# Patient Record
Sex: Female | Born: 1975 | Race: Black or African American | Hispanic: No | Marital: Single | Smoking: Never smoker
Health system: Southern US, Community
[De-identification: ages and names within clinical notes are randomized; demographics above are authoritative.]

## PROBLEM LIST (undated history)

## (undated) DIAGNOSIS — Z5189 Encounter for other specified aftercare: Secondary | ICD-10-CM

## (undated) DIAGNOSIS — T7840XA Allergy, unspecified, initial encounter: Secondary | ICD-10-CM

## (undated) DIAGNOSIS — E739 Lactose intolerance, unspecified: Secondary | ICD-10-CM

## (undated) DIAGNOSIS — G43909 Migraine, unspecified, not intractable, without status migrainosus: Secondary | ICD-10-CM

## (undated) DIAGNOSIS — L723 Sebaceous cyst: Secondary | ICD-10-CM

## (undated) DIAGNOSIS — D219 Benign neoplasm of connective and other soft tissue, unspecified: Secondary | ICD-10-CM

## (undated) DIAGNOSIS — K5900 Constipation, unspecified: Secondary | ICD-10-CM

## (undated) DIAGNOSIS — B977 Papillomavirus as the cause of diseases classified elsewhere: Secondary | ICD-10-CM

## (undated) DIAGNOSIS — F32A Depression, unspecified: Secondary | ICD-10-CM

## (undated) DIAGNOSIS — M549 Dorsalgia, unspecified: Secondary | ICD-10-CM

## (undated) DIAGNOSIS — I10 Essential (primary) hypertension: Secondary | ICD-10-CM

## (undated) DIAGNOSIS — R7303 Prediabetes: Secondary | ICD-10-CM

## (undated) DIAGNOSIS — F329 Major depressive disorder, single episode, unspecified: Secondary | ICD-10-CM

## (undated) DIAGNOSIS — B009 Herpesviral infection, unspecified: Secondary | ICD-10-CM

## (undated) DIAGNOSIS — F419 Anxiety disorder, unspecified: Secondary | ICD-10-CM

## (undated) DIAGNOSIS — I517 Cardiomegaly: Secondary | ICD-10-CM

## (undated) DIAGNOSIS — J45909 Unspecified asthma, uncomplicated: Secondary | ICD-10-CM

## (undated) DIAGNOSIS — N870 Mild cervical dysplasia: Secondary | ICD-10-CM

## (undated) DIAGNOSIS — N871 Moderate cervical dysplasia: Secondary | ICD-10-CM

## (undated) DIAGNOSIS — D649 Anemia, unspecified: Secondary | ICD-10-CM

## (undated) DIAGNOSIS — M79673 Pain in unspecified foot: Secondary | ICD-10-CM

## (undated) HISTORY — DX: Sebaceous cyst: L72.3

## (undated) HISTORY — PX: CERVICAL BIOPSY  W/ LOOP ELECTRODE EXCISION: SUR135

## (undated) HISTORY — DX: Mild cervical dysplasia: N87.0

## (undated) HISTORY — DX: Herpesviral infection, unspecified: B00.9

## (undated) HISTORY — DX: Allergy, unspecified, initial encounter: T78.40XA

## (undated) HISTORY — DX: Constipation, unspecified: K59.00

## (undated) HISTORY — DX: Dorsalgia, unspecified: M54.9

## (undated) HISTORY — DX: Anxiety disorder, unspecified: F41.9

## (undated) HISTORY — DX: Depression, unspecified: F32.A

## (undated) HISTORY — DX: Benign neoplasm of connective and other soft tissue, unspecified: D21.9

## (undated) HISTORY — DX: Moderate cervical dysplasia: N87.1

## (undated) HISTORY — PX: IRRIGATION AND DEBRIDEMENT SEBACEOUS CYST: SHX5255

## (undated) HISTORY — PX: TONSILLECTOMY AND ADENOIDECTOMY: SUR1326

## (undated) HISTORY — DX: Migraine, unspecified, not intractable, without status migrainosus: G43.909

## (undated) HISTORY — DX: Anemia, unspecified: D64.9

## (undated) HISTORY — PX: COLPOSCOPY: SHX161

## (undated) HISTORY — DX: Encounter for other specified aftercare: Z51.89

## (undated) HISTORY — DX: Cardiomegaly: I51.7

## (undated) HISTORY — DX: Prediabetes: R73.03

## (undated) HISTORY — DX: Papillomavirus as the cause of diseases classified elsewhere: B97.7

## (undated) HISTORY — DX: Pain in unspecified foot: M79.673

## (undated) HISTORY — DX: Lactose intolerance, unspecified: E73.9

## (undated) HISTORY — DX: Essential (primary) hypertension: I10

## (undated) HISTORY — DX: Unspecified asthma, uncomplicated: J45.909

---
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
## 1999-01-10 ENCOUNTER — Other Ambulatory Visit: Admission: RE | Admit: 1999-01-10 | Discharge: 1999-01-10 | Payer: Self-pay | Admitting: Obstetrics and Gynecology

## 1999-05-30 ENCOUNTER — Other Ambulatory Visit: Admission: RE | Admit: 1999-05-30 | Discharge: 1999-05-30 | Payer: Self-pay | Admitting: Obstetrics and Gynecology

## 1999-07-27 ENCOUNTER — Inpatient Hospital Stay (HOSPITAL_COMMUNITY): Admission: AD | Admit: 1999-07-27 | Discharge: 1999-07-27 | Payer: Self-pay | Admitting: Gynecology

## 1999-08-06 ENCOUNTER — Other Ambulatory Visit: Admission: RE | Admit: 1999-08-06 | Discharge: 1999-08-06 | Payer: Self-pay | Admitting: Obstetrics and Gynecology

## 1999-08-14 ENCOUNTER — Encounter: Admission: RE | Admit: 1999-08-14 | Discharge: 1999-11-12 | Payer: Self-pay | Admitting: Gynecology

## 1999-11-23 ENCOUNTER — Inpatient Hospital Stay (HOSPITAL_COMMUNITY): Admission: AD | Admit: 1999-11-23 | Discharge: 1999-11-23 | Payer: Self-pay | Admitting: Gynecology

## 1999-12-03 ENCOUNTER — Inpatient Hospital Stay (HOSPITAL_COMMUNITY): Admission: AD | Admit: 1999-12-03 | Discharge: 1999-12-06 | Payer: Self-pay | Admitting: Gynecology

## 1999-12-03 ENCOUNTER — Encounter (INDEPENDENT_AMBULATORY_CARE_PROVIDER_SITE_OTHER): Payer: Self-pay

## 2000-01-20 ENCOUNTER — Other Ambulatory Visit: Admission: RE | Admit: 2000-01-20 | Discharge: 2000-01-20 | Payer: Self-pay | Admitting: Internal Medicine

## 2000-04-20 ENCOUNTER — Emergency Department (HOSPITAL_COMMUNITY): Admission: EM | Admit: 2000-04-20 | Discharge: 2000-04-20 | Payer: Self-pay | Admitting: Emergency Medicine

## 2000-04-27 ENCOUNTER — Other Ambulatory Visit: Admission: RE | Admit: 2000-04-27 | Discharge: 2000-04-27 | Payer: Self-pay | Admitting: Gynecology

## 2000-10-15 DIAGNOSIS — N870 Mild cervical dysplasia: Secondary | ICD-10-CM

## 2000-10-15 HISTORY — DX: Mild cervical dysplasia: N87.0

## 2000-10-16 ENCOUNTER — Other Ambulatory Visit: Admission: RE | Admit: 2000-10-16 | Discharge: 2000-10-16 | Payer: Self-pay | Admitting: Gynecology

## 2000-10-16 ENCOUNTER — Encounter (INDEPENDENT_AMBULATORY_CARE_PROVIDER_SITE_OTHER): Payer: Self-pay | Admitting: Specialist

## 2001-06-11 ENCOUNTER — Other Ambulatory Visit: Admission: RE | Admit: 2001-06-11 | Discharge: 2001-06-11 | Payer: Self-pay | Admitting: Gynecology

## 2001-10-13 ENCOUNTER — Other Ambulatory Visit: Admission: RE | Admit: 2001-10-13 | Discharge: 2001-10-13 | Payer: Self-pay | Admitting: Gynecology

## 2002-01-28 ENCOUNTER — Other Ambulatory Visit: Admission: RE | Admit: 2002-01-28 | Discharge: 2002-01-28 | Payer: Self-pay | Admitting: Gynecology

## 2002-02-02 ENCOUNTER — Emergency Department (HOSPITAL_COMMUNITY): Admission: EM | Admit: 2002-02-02 | Discharge: 2002-02-02 | Payer: Self-pay

## 2002-08-15 DIAGNOSIS — N871 Moderate cervical dysplasia: Secondary | ICD-10-CM

## 2002-08-15 HISTORY — DX: Moderate cervical dysplasia: N87.1

## 2002-08-24 ENCOUNTER — Emergency Department (HOSPITAL_COMMUNITY): Admission: EM | Admit: 2002-08-24 | Discharge: 2002-08-24 | Payer: Self-pay | Admitting: Emergency Medicine

## 2003-02-09 ENCOUNTER — Other Ambulatory Visit: Admission: RE | Admit: 2003-02-09 | Discharge: 2003-02-09 | Payer: Self-pay | Admitting: Obstetrics and Gynecology

## 2003-04-06 ENCOUNTER — Other Ambulatory Visit: Admission: RE | Admit: 2003-04-06 | Discharge: 2003-04-06 | Payer: Self-pay | Admitting: Obstetrics and Gynecology

## 2004-10-21 ENCOUNTER — Other Ambulatory Visit: Admission: RE | Admit: 2004-10-21 | Discharge: 2004-10-21 | Payer: Self-pay | Admitting: Obstetrics and Gynecology

## 2004-12-30 ENCOUNTER — Ambulatory Visit: Payer: Self-pay | Admitting: Internal Medicine

## 2005-01-06 ENCOUNTER — Ambulatory Visit: Payer: Self-pay | Admitting: Internal Medicine

## 2005-01-08 ENCOUNTER — Ambulatory Visit: Payer: Self-pay | Admitting: Internal Medicine

## 2005-03-06 ENCOUNTER — Ambulatory Visit: Payer: Self-pay | Admitting: Internal Medicine

## 2005-03-06 ENCOUNTER — Other Ambulatory Visit: Admission: RE | Admit: 2005-03-06 | Discharge: 2005-03-06 | Payer: Self-pay | Admitting: Gynecology

## 2005-03-17 ENCOUNTER — Encounter: Admission: RE | Admit: 2005-03-17 | Discharge: 2005-06-15 | Payer: Self-pay | Admitting: Internal Medicine

## 2005-03-31 ENCOUNTER — Ambulatory Visit: Payer: Self-pay | Admitting: Internal Medicine

## 2005-04-15 ENCOUNTER — Ambulatory Visit: Payer: Self-pay | Admitting: Internal Medicine

## 2005-12-02 ENCOUNTER — Other Ambulatory Visit: Admission: RE | Admit: 2005-12-02 | Discharge: 2005-12-02 | Payer: Self-pay | Admitting: *Deleted

## 2006-01-07 ENCOUNTER — Ambulatory Visit: Payer: Self-pay | Admitting: Internal Medicine

## 2006-01-12 ENCOUNTER — Ambulatory Visit: Payer: Self-pay | Admitting: Internal Medicine

## 2006-03-03 ENCOUNTER — Ambulatory Visit: Payer: Self-pay | Admitting: Internal Medicine

## 2006-03-10 ENCOUNTER — Ambulatory Visit: Payer: Self-pay | Admitting: Internal Medicine

## 2006-03-11 ENCOUNTER — Encounter: Admission: RE | Admit: 2006-03-11 | Discharge: 2006-06-01 | Payer: Self-pay | Admitting: Internal Medicine

## 2007-03-16 DIAGNOSIS — B977 Papillomavirus as the cause of diseases classified elsewhere: Secondary | ICD-10-CM

## 2007-03-16 HISTORY — DX: Papillomavirus as the cause of diseases classified elsewhere: B97.7

## 2007-03-25 ENCOUNTER — Other Ambulatory Visit: Admission: RE | Admit: 2007-03-25 | Discharge: 2007-03-25 | Payer: Self-pay | Admitting: Gynecology

## 2007-04-17 ENCOUNTER — Emergency Department (HOSPITAL_COMMUNITY): Admission: EM | Admit: 2007-04-17 | Discharge: 2007-04-17 | Payer: Self-pay | Admitting: Family Medicine

## 2007-06-01 ENCOUNTER — Ambulatory Visit: Payer: Self-pay | Admitting: Internal Medicine

## 2007-06-14 ENCOUNTER — Encounter: Admission: RE | Admit: 2007-06-14 | Discharge: 2007-06-14 | Payer: Self-pay | Admitting: Ophthalmology

## 2007-06-14 ENCOUNTER — Encounter (INDEPENDENT_AMBULATORY_CARE_PROVIDER_SITE_OTHER): Payer: Self-pay | Admitting: Ophthalmology

## 2007-06-14 ENCOUNTER — Other Ambulatory Visit: Admission: RE | Admit: 2007-06-14 | Discharge: 2007-06-14 | Payer: Self-pay | Admitting: Ophthalmology

## 2008-01-31 ENCOUNTER — Emergency Department (HOSPITAL_COMMUNITY): Admission: EM | Admit: 2008-01-31 | Discharge: 2008-01-31 | Payer: Self-pay | Admitting: Family Medicine

## 2008-02-17 ENCOUNTER — Other Ambulatory Visit: Admission: RE | Admit: 2008-02-17 | Discharge: 2008-02-17 | Payer: Self-pay | Admitting: Gynecology

## 2008-05-30 ENCOUNTER — Encounter: Payer: Self-pay | Admitting: *Deleted

## 2008-05-30 DIAGNOSIS — J309 Allergic rhinitis, unspecified: Secondary | ICD-10-CM | POA: Insufficient documentation

## 2008-05-30 DIAGNOSIS — J4520 Mild intermittent asthma, uncomplicated: Secondary | ICD-10-CM

## 2008-05-30 DIAGNOSIS — J329 Chronic sinusitis, unspecified: Secondary | ICD-10-CM | POA: Insufficient documentation

## 2008-11-08 ENCOUNTER — Encounter: Payer: Self-pay | Admitting: Women's Health

## 2008-11-08 ENCOUNTER — Ambulatory Visit: Payer: Self-pay | Admitting: Women's Health

## 2008-11-08 ENCOUNTER — Other Ambulatory Visit: Admission: RE | Admit: 2008-11-08 | Discharge: 2008-11-08 | Payer: Self-pay | Admitting: Obstetrics and Gynecology

## 2009-02-09 ENCOUNTER — Ambulatory Visit: Payer: Self-pay | Admitting: Internal Medicine

## 2009-02-09 LAB — CONVERTED CEMR LAB
ALT: 17 units/L (ref 0–35)
AST: 19 units/L (ref 0–37)
Albumin: 3.9 g/dL (ref 3.5–5.2)
Alkaline Phosphatase: 81 units/L (ref 39–117)
BUN: 10 mg/dL (ref 6–23)
Basophils Absolute: 0 10*3/uL (ref 0.0–0.1)
Basophils Relative: 0.5 % (ref 0.0–3.0)
Bilirubin Urine: NEGATIVE
Bilirubin, Direct: 0.2 mg/dL (ref 0.0–0.3)
CO2: 27 meq/L (ref 19–32)
Calcium: 9.1 mg/dL (ref 8.4–10.5)
Chloride: 106 meq/L (ref 96–112)
Cholesterol: 184 mg/dL (ref 0–200)
Creatinine, Ser: 0.6 mg/dL (ref 0.4–1.2)
Eosinophils Absolute: 0.1 10*3/uL (ref 0.0–0.7)
Eosinophils Relative: 1.6 % (ref 0.0–5.0)
GFR calc Af Amer: 149 mL/min
GFR calc non Af Amer: 123 mL/min
Glucose, Bld: 105 mg/dL — ABNORMAL HIGH (ref 70–99)
HCT: 37.2 % (ref 36.0–46.0)
HDL: 58.6 mg/dL (ref >39.0)
Hemoglobin, Urine: NEGATIVE
Hemoglobin: 12.6 g/dL (ref 12.0–15.0)
Ketones, ur: NEGATIVE mg/dL
LDL Cholesterol: 118 mg/dL — ABNORMAL HIGH (ref 0–99)
Leukocytes, UA: NEGATIVE
Lymphocytes Relative: 43 % (ref 12.0–46.0)
MCHC: 34 g/dL (ref 30.0–36.0)
MCV: 82.9 fL (ref 78.0–100.0)
Monocytes Absolute: 0.4 10*3/uL (ref 0.1–1.0)
Monocytes Relative: 7.7 % (ref 3.0–12.0)
Neutro Abs: 2.4 10*3/uL (ref 1.4–7.7)
Neutrophils Relative %: 47.2 % (ref 43.0–77.0)
Nitrite: NEGATIVE
Platelets: 322 10*3/uL (ref 150–400)
Potassium: 3.8 meq/L (ref 3.5–5.1)
RBC: 4.48 M/uL (ref 3.87–5.11)
RDW: 11.6 % (ref 11.5–14.6)
Sodium: 139 meq/L (ref 135–145)
Specific Gravity, Urine: >=1.03 (ref 1.000–1.035)
TSH: 0.91 microintl units/mL (ref 0.35–5.50)
Total Bilirubin: 1 mg/dL (ref 0.3–1.2)
Total CHOL/HDL Ratio: 3.1
Total Protein: 7.2 g/dL (ref 6.0–8.3)
Triglycerides: 39 mg/dL (ref 0–149)
Urine Glucose: NEGATIVE mg/dL
Urobilinogen, UA: 0.2 (ref 0.0–1.0)
VLDL: 8 mg/dL (ref 0–40)
WBC: 5.1 10*3/uL (ref 4.5–10.5)
pH: 6 (ref 5.0–8.0)

## 2009-02-16 ENCOUNTER — Encounter: Payer: Self-pay | Admitting: Internal Medicine

## 2009-02-16 DIAGNOSIS — R7309 Other abnormal glucose: Secondary | ICD-10-CM | POA: Insufficient documentation

## 2009-02-20 DIAGNOSIS — K219 Gastro-esophageal reflux disease without esophagitis: Secondary | ICD-10-CM

## 2009-06-15 ENCOUNTER — Other Ambulatory Visit: Admission: RE | Admit: 2009-06-15 | Discharge: 2009-06-15 | Payer: Self-pay | Admitting: Obstetrics and Gynecology

## 2009-06-15 ENCOUNTER — Encounter: Payer: Self-pay | Admitting: Obstetrics and Gynecology

## 2009-06-15 ENCOUNTER — Ambulatory Visit: Payer: Self-pay | Admitting: Obstetrics and Gynecology

## 2010-12-23 ENCOUNTER — Ambulatory Visit
Admission: RE | Admit: 2010-12-23 | Discharge: 2010-12-23 | Payer: Self-pay | Source: Home / Self Care | Attending: Women's Health | Admitting: Women's Health

## 2011-08-04 ENCOUNTER — Other Ambulatory Visit: Payer: Self-pay | Admitting: *Deleted

## 2011-08-04 MED ORDER — NORGESTIM-ETH ESTRAD TRIPHASIC 0.18/0.215/0.25 MG-35 MCG PO TABS: 1.0000 | ORAL_TABLET | Freq: Every day | ORAL | Status: DC

## 2011-08-04 NOTE — Telephone Encounter (Signed)
Pt called requesting a sample of ortho tri cyclen sample, we dont ave any. Per [place] can get for $9 at [facility] or [facility]. Ok per [place] to send a Rx to pharmacy but pt has to schedule AEX. Apt 9/27. Starts new job 08/25/11. kw

## 2011-08-05 MED ORDER — NORGESTIM-ETH ESTRAD TRIPHASIC 0.18/0.215/0.25 MG-35 MCG PO TABS: 1.0000 | ORAL_TABLET | Freq: Every day | ORAL | Status: DC

## 2011-08-05 NOTE — Telephone Encounter (Signed)
Addended byValeda Malm L on: 08/05/2011 03:10 PM   Modules accepted: Orders

## 2011-08-05 NOTE — Telephone Encounter (Signed)
Changed oc's to escribe. Was set on print. Patient said rx was not there.

## 2011-09-04 DIAGNOSIS — B009 Herpesviral infection, unspecified: Secondary | ICD-10-CM | POA: Insufficient documentation

## 2011-09-04 DIAGNOSIS — B977 Papillomavirus as the cause of diseases classified elsewhere: Secondary | ICD-10-CM | POA: Insufficient documentation

## 2011-09-04 DIAGNOSIS — N870 Mild cervical dysplasia: Secondary | ICD-10-CM | POA: Insufficient documentation

## 2011-09-11 ENCOUNTER — Encounter: Payer: Self-pay | Admitting: Women's Health

## 2011-11-20 ENCOUNTER — Encounter: Payer: Self-pay | Admitting: *Deleted

## 2011-11-24 ENCOUNTER — Encounter: Payer: Self-pay | Admitting: Women's Health

## 2011-12-19 ENCOUNTER — Encounter: Payer: Self-pay | Admitting: Women's Health

## 2012-01-14 ENCOUNTER — Encounter: Payer: Self-pay | Admitting: Women's Health

## 2012-01-15 ENCOUNTER — Other Ambulatory Visit: Payer: Self-pay | Admitting: Women's Health

## 2012-01-15 NOTE — Telephone Encounter (Signed)
Pt has her CE scheduled 01/19/12.

## 2012-01-19 ENCOUNTER — Ambulatory Visit (INDEPENDENT_AMBULATORY_CARE_PROVIDER_SITE_OTHER): Payer: 59 | Admitting: Women's Health

## 2012-01-19 ENCOUNTER — Encounter: Payer: Self-pay | Admitting: Women's Health

## 2012-01-19 ENCOUNTER — Other Ambulatory Visit (HOSPITAL_COMMUNITY)
Admission: RE | Admit: 2012-01-19 | Discharge: 2012-01-19 | Disposition: A | Discharge status: Home / Self Care | Payer: 59 | Source: Ambulatory Visit | Attending: Obstetrics and Gynecology | Admitting: Obstetrics and Gynecology

## 2012-01-19 VITALS — BP 126/82 | Ht 63.0 in | Wt 244.0 lb

## 2012-01-19 DIAGNOSIS — Z01419 Encounter for gynecological examination (general) (routine) without abnormal findings: Secondary | ICD-10-CM | POA: Insufficient documentation

## 2012-01-19 DIAGNOSIS — Z833 Family history of diabetes mellitus: Secondary | ICD-10-CM

## 2012-01-19 DIAGNOSIS — Z13220 Encounter for screening for lipoid disorders: Secondary | ICD-10-CM

## 2012-01-19 NOTE — Progress Notes (Signed)
Lynn Hansen 10-Dec-1976 [id number]    History:    The patient presents for annual exam.  Monthly 3 day cycle, not sexually active. Does have a negative STD screen. 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 History of HSV with rare outbreaks.   Past medical history, past surgical history, family history and social history were all reviewed and documented in the EPIC chart. Daughter 33 , doing well. Works at [employer]. Adopted   ROS:  A  ROS was performed and pertinent positives and negatives are included in the history.  Exam:  Filed Vitals:   01/19/12 0807  BP: 126/82    General appearance:  Normal Head/Neck:  Normal, without cervical or supraclavicular adenopathy. Thyroid:  Symmetrical, normal in size, without palpable masses or nodularity. Respiratory  Effort:  Normal  Auscultation:  Clear without wheezing or rhonchi Cardiovascular  Auscultation:  Regular rate, without rubs, murmurs or gallops  Edema/varicosities:  Not grossly evident Abdominal  Soft,nontender, without masses, guarding or rebound.  Liver/spleen:  No organomegaly noted  Hernia:  None appreciated  Skin  Inspection:  Grossly normal  Palpation:  Grossly normal Neurologic/psychiatric  Orientation:  Normal with appropriate conversation.  Mood/affect:  Normal  Genitourinary    Breasts: Examined lying and sitting.     Right: Without masses, retractions, discharge or axillary adenopathy.     Left: Without masses, retractions, discharge or axillary adenopathy.   Inguinal/mons:  Normal without inguinal adenopathy  External genitalia:  Normal  BUS/Urethra/Skene's glands:  Normal  Bladder:  Normal  Vagina:  Normal  Cervix:  Normal  Uterus:   normal in size, shape and contour.  Midline and mobile  Adnexa/parametria:     Rt: Without masses or tenderness.   Lt: Without masses or tenderness.  Anus and perineum: Normal  Digital rectal  exam: Normal sphincter tone without palpated masses or tenderness  Assessment/Plan:  36 y.o. SBF G3 P1 for annual exam with no complaints.  History a LEEP CIN-2 endocervical margin involvement, ectocervical margins free Paps normal 2009,2010-no paps after 2010 Obesity  Plan: Reviewed importance of Pap followup, (had no insurance). SBE's, exercise, calcium rich diet, fish oil supplement daily encouraged. Discussion on importance of weight loss for health. Reviewed Weight Watchers, decreasing calories and increasing exercise. Contraception options were reviewed, declines need at this time, would like Mirena IUD. Information given reviewed slight risk for infection, hemorrhage, perforation. Review Dr. Eda Paschal to place with cycle. Condoms encouraged to become sexually active. CBC, glucose, lipid profile, UA and Pap.   Harrington Challenger [facility], 9:11 AM 01/19/2012

## 2012-01-27 ENCOUNTER — Other Ambulatory Visit: Payer: Self-pay | Admitting: *Deleted

## 2012-01-27 DIAGNOSIS — R319 Hematuria, unspecified: Secondary | ICD-10-CM

## 2012-01-30 ENCOUNTER — Other Ambulatory Visit: Payer: 59

## 2012-02-02 ENCOUNTER — Encounter: Payer: Self-pay | Admitting: *Deleted

## 2012-02-02 NOTE — Progress Notes (Signed)
Patient ID: Lynn Hansen, female   DOB: May 22, 1976, 36 y.o.   MRN: [id number] Pt called wanting refill on valtrex rx, left message for pt to call to find out which pharmacy rx will be sent to.

## 2012-04-12 ENCOUNTER — Other Ambulatory Visit: Payer: Self-pay | Admitting: *Deleted

## 2012-04-12 MED ORDER — FLUCONAZOLE 150 MG PO TABS: 150.0000 mg | ORAL_TABLET | Freq: Once | ORAL | Status: DC

## 2012-04-12 NOTE — Telephone Encounter (Signed)
Okay to call in Diflucan 150 , 1 dose, #1, no refill for patient, instruct office visit if no relief.

## 2012-04-26 ENCOUNTER — Other Ambulatory Visit: Payer: Self-pay | Admitting: *Deleted

## 2012-07-16 ENCOUNTER — Other Ambulatory Visit: Payer: Self-pay | Admitting: Women's Health

## 2012-07-16 NOTE — Telephone Encounter (Signed)
Okay for Diflucan 150mg   times one dose office visit if no relief.

## 2012-07-26 ENCOUNTER — Telehealth: Payer: Self-pay | Admitting: Internal Medicine

## 2012-07-26 NOTE — Telephone Encounter (Signed)
Ok Thx 

## 2012-07-26 NOTE — Telephone Encounter (Signed)
Lynn Hansen was last seen in 2008 by Dr Posey Rea.  Do you want [place] to schedule her with you as a new pt? She has [employer].  She has 3 No Shows with other doctors and cancellations.

## 2012-07-30 NOTE — Telephone Encounter (Signed)
New Pt appt made for Nov. 19.  Pt is aware.

## 2012-10-08 ENCOUNTER — Encounter: Payer: Self-pay | Admitting: *Deleted

## 2012-10-08 NOTE — Progress Notes (Signed)
Patient ID: Lynn Hansen, female   DOB: [birth year], 36 y.o.   MRN: [id number] Pt called requesting refill on Diflucan due to yeast infection. Leave message on pt voicemail OV best.

## 2012-10-15 ENCOUNTER — Ambulatory Visit: Payer: 59 | Admitting: Women's Health

## 2012-11-02 ENCOUNTER — Ambulatory Visit: Payer: 59 | Admitting: Internal Medicine

## 2012-11-02 DIAGNOSIS — Z0289 Encounter for other administrative examinations: Secondary | ICD-10-CM

## 2012-11-08 ENCOUNTER — Other Ambulatory Visit: Payer: Self-pay | Admitting: Women's Health

## 2012-11-08 DIAGNOSIS — Z1231 Encounter for screening mammogram for malignant neoplasm of breast: Secondary | ICD-10-CM

## 2012-12-17 ENCOUNTER — Ambulatory Visit: Payer: 59

## 2013-01-03 ENCOUNTER — Ambulatory Visit (INDEPENDENT_AMBULATORY_CARE_PROVIDER_SITE_OTHER): Payer: 59 | Admitting: Women's Health

## 2013-01-03 ENCOUNTER — Encounter: Payer: Self-pay | Admitting: Women's Health

## 2013-01-03 DIAGNOSIS — N898 Other specified noninflammatory disorders of vagina: Secondary | ICD-10-CM

## 2013-01-03 LAB — WET PREP FOR TRICH, YEAST, CLUE

## 2013-01-03 MED ORDER — FLUCONAZOLE 150 MG PO TABS: ORAL_TABLET | ORAL | Status: DC

## 2013-01-03 NOTE — Patient Instructions (Addendum)
Monilial Vaginitis Vaginitis in a soreness, swelling and redness (inflammation) of the vagina and vulva. Monilial vaginitis is not a sexually transmitted infection. CAUSES  Yeast vaginitis is caused by yeast (candida) that is normally found in your vagina. With a yeast infection, the candida has overgrown in number to a point that upsets the chemical balance. SYMPTOMS   White, thick vaginal discharge.  Swelling, itching, redness and irritation of the vagina and possibly the lips of the vagina (vulva).  Burning or painful urination.  Painful intercourse. DIAGNOSIS  Things that may contribute to monilial vaginitis are:  Postmenopausal and virginal states.  Pregnancy.  Infections.  Being tired, sick or stressed, especially if you had monilial vaginitis in the past.  Diabetes. Good control will help lower the chance.  Birth control pills.  Tight fitting garments.  Using bubble bath, feminine sprays, douches or deodorant tampons.  Taking certain medications that kill germs (antibiotics).  Sporadic recurrence can occur if you become ill. TREATMENT  Your caregiver will give you medication.  There are several kinds of anti monilial vaginal creams and suppositories specific for monilial vaginitis. For recurrent yeast infections, use a suppository or cream in the vagina 2 times a week, or as directed.  Anti-monilial or steroid cream for the itching or irritation of the vulva may also be used. Get your caregiver's permission.  Painting the vagina with methylene blue solution may help if the monilial cream does not work.  Eating yogurt may help prevent monilial vaginitis. HOME CARE INSTRUCTIONS   Finish all medication as prescribed.  Do not have sex until treatment is completed or after your caregiver tells you it is okay.  Take warm sitz baths.  Do not douche.  Do not use tampons, especially scented ones.  Wear cotton underwear.  Avoid tight pants and panty  hose.  Tell your sexual partner that you have a yeast infection. They should go to their caregiver if they have symptoms such as mild rash or itching.  Your sexual partner should be treated as well if your infection is difficult to eliminate.  Practice safer sex. Use condoms.  Some vaginal medications cause latex condoms to fail. Vaginal medications that harm condoms are:  Cleocin cream.  Butoconazole (Femstat).  Terconazole (Terazol) vaginal suppository.  Miconazole (Monistat) (may be purchased over the counter). SEEK MEDICAL CARE IF:   You have a temperature by mouth above 102 F (38.9 C).  The infection is getting worse after 2 days of treatment.  The infection is not getting better after 3 days of treatment.  You develop blisters in or around your vagina.  You develop vaginal bleeding, and it is not your menstrual period.  You have pain when you urinate.  You develop intestinal problems.  You have pain with sexual intercourse. Document Released: 09/10/2005 Document Revised: 02/23/2012 Document Reviewed: 05/25/2009 ExitCare Patient Information 2013 [employer].  

## 2013-01-03 NOTE — Progress Notes (Signed)
Patient ID: Lynn Hansen, female   DOB: 1976-07-31, 37 y.o.   MRN: [id number] Presents with complaint of increased vaginal discharge with intense itching. Started 3 days ago. Has been working out at [employer] and getting more exercise when symptoms started. Same partner, denies urinary symptoms.  Exam: External genitalia erythematous at introitus, speculum exam moderate amount of a curdy white discharge noted, no odor. Wet prep positive for yeast. Bimanual no CMT or tenderness, abdomen obese.  Yeast vaginitis  Plan: Diflucan 150 by mouth today repeat in 3 days if needed. Prescription, yeast prevention discussed will call if no relief of symptoms.

## 2013-01-19 ENCOUNTER — Encounter: Payer: 59 | Admitting: Women's Health

## 2013-01-27 ENCOUNTER — Encounter: Payer: 59 | Admitting: Women's Health

## 2013-02-09 ENCOUNTER — Other Ambulatory Visit: Payer: Self-pay

## 2013-02-09 DIAGNOSIS — Z1231 Encounter for screening mammogram for malignant neoplasm of breast: Secondary | ICD-10-CM

## 2013-02-23 ENCOUNTER — Ambulatory Visit: Payer: 59

## 2013-04-13 ENCOUNTER — Ambulatory Visit: Payer: 59 | Admitting: Internal Medicine

## 2013-04-13 DIAGNOSIS — Z0289 Encounter for other administrative examinations: Secondary | ICD-10-CM

## 2013-06-10 ENCOUNTER — Encounter: Payer: Self-pay | Admitting: Women's Health

## 2013-06-28 ENCOUNTER — Encounter: Payer: Self-pay | Admitting: Gynecology

## 2013-06-28 ENCOUNTER — Ambulatory Visit (INDEPENDENT_AMBULATORY_CARE_PROVIDER_SITE_OTHER): Payer: 59 | Admitting: Gynecology

## 2013-06-28 DIAGNOSIS — N898 Other specified noninflammatory disorders of vagina: Secondary | ICD-10-CM

## 2013-06-28 DIAGNOSIS — B373 Candidiasis of vulva and vagina: Secondary | ICD-10-CM

## 2013-06-28 LAB — WET PREP FOR TRICH, YEAST, CLUE: Clue Cells Wet Prep HPF POC: NONE SEEN

## 2013-06-28 MED ORDER — FLUCONAZOLE 150 MG PO TABS: 150.0000 mg | ORAL_TABLET | Freq: Once | ORAL | Status: DC
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
## 2013-06-28 NOTE — Patient Instructions (Signed)
A Diflucan pill now. Repeated as needed for vaginal irritation and discharge.

## 2013-07-22 ENCOUNTER — Ambulatory Visit (INDEPENDENT_AMBULATORY_CARE_PROVIDER_SITE_OTHER): Payer: 59 | Admitting: Women's Health

## 2013-07-22 DIAGNOSIS — Z01419 Encounter for gynecological examination (general) (routine) without abnormal findings: Secondary | ICD-10-CM

## 2013-08-10 NOTE — Progress Notes (Signed)
Patient ID: Lynn Hansen, female   DOB: [birth year], 37 y.o.   MRN: [id number] No show

## 2013-08-29 ENCOUNTER — Encounter (HOSPITAL_COMMUNITY): Payer: Self-pay

## 2013-08-29 ENCOUNTER — Emergency Department (INDEPENDENT_AMBULATORY_CARE_PROVIDER_SITE_OTHER)
Admission: EM | Admit: 2013-08-29 | Discharge: 2013-08-29 | Disposition: A | Discharge status: Home / Self Care | Payer: 59 | Source: Home / Self Care | Attending: Family Medicine | Admitting: Family Medicine

## 2013-08-29 DIAGNOSIS — L723 Sebaceous cyst: Secondary | ICD-10-CM

## 2013-08-29 MED ORDER — SULFAMETHOXAZOLE-TRIMETHOPRIM 800-160 MG PO TABS: 1.0000 | ORAL_TABLET | Freq: Two times a day (BID) | ORAL | Status: DC
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
## 2013-08-29 NOTE — ED Notes (Signed)
C/o lump on right ear lobe for past few days, getting larger; reportedly similar to lump on breast that burst recently

## 2013-08-30 NOTE — ED Provider Notes (Signed)
Medical screening examination/treatment/procedure(s) were performed by resident physician or non-physician practitioner and as supervising physician I was immediately available for consultation/collaboration.   Barkley Bruns MD.   Linna Hoff, MD 08/30/13 [phone]

## 2013-09-27 ENCOUNTER — Ambulatory Visit (INDEPENDENT_AMBULATORY_CARE_PROVIDER_SITE_OTHER): Payer: 59 | Admitting: Women's Health

## 2013-09-27 ENCOUNTER — Encounter: Payer: Self-pay | Admitting: Women's Health

## 2013-09-27 ENCOUNTER — Other Ambulatory Visit (HOSPITAL_COMMUNITY)
Admission: RE | Admit: 2013-09-27 | Discharge: 2013-09-27 | Disposition: A | Discharge status: Home / Self Care | Payer: 59 | Source: Ambulatory Visit | Attending: Gynecology | Admitting: Gynecology

## 2013-09-27 VITALS — BP 128/88 | Ht 63.5 in | Wt 247.0 lb

## 2013-09-27 DIAGNOSIS — E079 Disorder of thyroid, unspecified: Secondary | ICD-10-CM

## 2013-09-27 DIAGNOSIS — Z01419 Encounter for gynecological examination (general) (routine) without abnormal findings: Secondary | ICD-10-CM | POA: Insufficient documentation

## 2013-09-27 DIAGNOSIS — Z833 Family history of diabetes mellitus: Secondary | ICD-10-CM

## 2013-09-27 LAB — URINALYSIS W MICROSCOPIC + REFLEX CULTURE
Bilirubin Urine: NEGATIVE
Glucose, UA: NEGATIVE mg/dL
Ketones, ur: NEGATIVE mg/dL
Specific Gravity, Urine: 1.02 (ref 1.005–1.030)
Urobilinogen, UA: 4 mg/dL — ABNORMAL HIGH (ref 0.0–1.0)

## 2013-09-27 NOTE — Patient Instructions (Signed)

## 2013-09-27 NOTE — Progress Notes (Signed)
Lynn Hansen 13-Apr-1976 [id number]    History:    The patient presents for annual exam. 2003 CIN-2 LEEP with positive endocervical margins and negative ectocervical margins, normal Paps after. HSV rare outbreaks. Concerns about blood pressure/ self-checks have been elevated.   Unknown FH/ adopted.  Regular periods, condoms,  not sexually active/ same partner for 3 years.   Saw orthopedist in February 2014 for foot pain/reports all normal lab work- diagnosed with Plantar Fasciitis.  Refused flu vaccine.   Past medical history, past surgical history, family history and social history were all reviewed and documented in the EPIC chart. 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  Exam:  Filed Vitals:   09/27/13 0935  BP: 128/88    General appearance:  Obese Head/Neck:  Normal, without cervical or supraclavicular adenopathy. Thyroid:  Symmetrical, normal in size, without palpable masses or nodularity. Respiratory  Effort:  Normal  Auscultation:  Clear without wheezing or rhonchi Cardiovascular  Auscultation:  Regular rate, without rubs, murmurs or gallops  Edema/varicosities:  Not grossly evident Abdominal  Soft,nontender, without masses, guarding or rebound.  Liver/spleen:  No organomegaly noted  Hernia:  None appreciated  Skin  Inspection:  Grossly normal  Palpation:  Grossly normal Neurologic/psychiatric  Orientation:  Normal with appropriate conversation.  Mood/affect:  Normal  Genitourinary    Breasts: Examined lying and sitting.     Right: Without masses, retractions, discharge or axillary adenopathy.     Left: Without masses, retractions, discharge or axillary adenopathy.   Inguinal/mons:  Normal without inguinal adenopathy  External genitalia:  Normal  BUS/Urethra/Skene's glands:  Normal  Bladder:  Normal; UA  Vagina:   Normal   Cervix:  Normal  Uterus:  Normal in size, shape and contour.  Midline and mobile  Adnexa/parametria:     Rt: Without masses or tenderness.   Lt: Without masses or tenderness.  Anus and perineum: Normal  Digital rectal exam: Normal sphincter tone without palpated masses or tenderness  Assessment/Plan:  37 y.o. G1P1 SBF for annual exam.     2003 LEEP CIN-2 normal Paps after HSV Obesity Borderline B/P  Plan: Continue healthy lifestyle change/ continue exercising with less pressure on joints/ low-carb/ sugar/ sodium diet. SBE's , continue condoms and sexually active.   Monitor blood pressure and if continues greater than 130/80 seek care at primary care for possible medication. UA, Pap.  Lynn Hansen [facility], 10:11 AM 09/27/2013

## 2013-09-30 ENCOUNTER — Other Ambulatory Visit: Payer: Self-pay | Admitting: Women's Health

## 2013-09-30 MED ORDER — FLUCONAZOLE 150 MG PO TABS: 150.0000 mg | ORAL_TABLET | Freq: Once | ORAL | Status: DC

## 2013-11-18 ENCOUNTER — Ambulatory Visit (INDEPENDENT_AMBULATORY_CARE_PROVIDER_SITE_OTHER): Payer: 59 | Admitting: Women's Health

## 2013-11-18 ENCOUNTER — Encounter: Payer: Self-pay | Admitting: Women's Health

## 2013-11-18 DIAGNOSIS — B3731 Acute candidiasis of vulva and vagina: Secondary | ICD-10-CM

## 2013-11-18 DIAGNOSIS — L293 Anogenital pruritus, unspecified: Secondary | ICD-10-CM

## 2013-11-18 DIAGNOSIS — N898 Other specified noninflammatory disorders of vagina: Secondary | ICD-10-CM

## 2013-11-18 DIAGNOSIS — B373 Candidiasis of vulva and vagina: Secondary | ICD-10-CM

## 2013-11-18 LAB — WET PREP FOR TRICH, YEAST, CLUE
Clue Cells Wet Prep HPF POC: NONE SEEN
Trich, Wet Prep: NONE SEEN

## 2013-11-18 MED ORDER — FLUCONAZOLE 100 MG PO TABS: ORAL_TABLET | ORAL | Status: DC

## 2013-11-18 NOTE — Progress Notes (Signed)
Patient ID: Lynn Hansen, female   DOB: Oct 05, 1976, 37 y.o.   MRN: [id number] Presents with complaint of vaginal itching. States vaginal itching symptoms increase after exercise. Was treated for yeast 2 months ago, good relief with one Diflucan. Same partner. Denies urinary symptoms, abdominal pain or fever.  Exam: Appears well, obese, external genitalia erythematous at introitus, speculum exam moderate amount of a curdy discharge noted. Wet prep positive for moderate yeast.  Yeast vaginitis  Plan: Diflucan 100 mg 2 tablets today repeat if needed. Aware of yeast prevention. Instructed to call if no relief of symptoms. Continue regular exercise

## 2013-11-18 NOTE — Patient Instructions (Signed)
Monilial Vaginitis  Vaginitis in a soreness, swelling and redness (inflammation) of the vagina and vulva. Monilial vaginitis is not a sexually transmitted infection.  CAUSES   Yeast vaginitis is caused by yeast (candida) that is normally found in your vagina. With a yeast infection, the candida has overgrown in number to a point that upsets the chemical balance.  SYMPTOMS   · White, thick vaginal discharge.  · Swelling, itching, redness and irritation of the vagina and possibly the lips of the vagina (vulva).  · Burning or painful urination.  · Painful intercourse.  DIAGNOSIS   Things that may contribute to monilial vaginitis are:  · Postmenopausal and virginal states.  · Pregnancy.  · Infections.  · Being tired, sick or stressed, especially if you had monilial vaginitis in the past.  · Diabetes. Good control will help lower the chance.  · Birth control pills.  · Tight fitting garments.  · Using bubble bath, feminine sprays, douches or deodorant tampons.  · Taking certain medications that kill germs (antibiotics).  · Sporadic recurrence can occur if you become ill.  TREATMENT   Your caregiver will give you medication.  · There are several kinds of anti monilial vaginal creams and suppositories specific for monilial vaginitis. For recurrent yeast infections, use a suppository or cream in the vagina 2 times a week, or as directed.  · Anti-monilial or steroid cream for the itching or irritation of the vulva may also be used. Get your caregiver's permission.  · Painting the vagina with methylene blue solution may help if the monilial cream does not work.  · Eating yogurt may help prevent monilial vaginitis.  HOME CARE INSTRUCTIONS   · Finish all medication as prescribed.  · Do not have sex until treatment is completed or after your caregiver tells you it is okay.  · Take warm sitz baths.  · Do not douche.  · Do not use tampons, especially scented ones.  · Wear cotton underwear.  · Avoid tight pants and panty  hose.  · Tell your sexual partner that you have a yeast infection. They should go to their caregiver if they have symptoms such as mild rash or itching.  · Your sexual partner should be treated as well if your infection is difficult to eliminate.  · Practice safer sex. Use condoms.  · Some vaginal medications cause latex condoms to fail. Vaginal medications that harm condoms are:  · Cleocin cream.  · Butoconazole (Femstat®).  · Terconazole (Terazol®) vaginal suppository.  · Miconazole (Monistat®) (may be purchased over the counter).  SEEK MEDICAL CARE IF:   · You have a temperature by mouth above 102° F (38.9° C).  · The infection is getting worse after 2 days of treatment.  · The infection is not getting better after 3 days of treatment.  · You develop blisters in or around your vagina.  · You develop vaginal bleeding, and it is not your menstrual period.  · You have pain when you urinate.  · You develop intestinal problems.  · You have pain with sexual intercourse.  Document Released: 09/10/2005 Document Revised: 02/23/2012 Document Reviewed: 05/25/2009  ExitCare® Patient Information ©2014 [employer].

## 2014-03-06 ENCOUNTER — Emergency Department (HOSPITAL_COMMUNITY)
Admission: EM | Admit: 2014-03-06 | Discharge: 2014-03-06 | Disposition: A | Discharge status: Home / Self Care | Payer: 59 | Attending: Emergency Medicine | Admitting: Emergency Medicine

## 2014-03-06 ENCOUNTER — Emergency Department (HOSPITAL_COMMUNITY): Payer: 59

## 2014-03-06 ENCOUNTER — Encounter (HOSPITAL_COMMUNITY): Payer: Self-pay | Admitting: Emergency Medicine

## 2014-03-06 ENCOUNTER — Encounter: Payer: Self-pay | Admitting: Internal Medicine

## 2014-03-06 DIAGNOSIS — R1012 Left upper quadrant pain: Secondary | ICD-10-CM | POA: Insufficient documentation

## 2014-03-06 DIAGNOSIS — R1013 Epigastric pain: Secondary | ICD-10-CM | POA: Insufficient documentation

## 2014-03-06 DIAGNOSIS — R0789 Other chest pain: Secondary | ICD-10-CM | POA: Insufficient documentation

## 2014-03-06 DIAGNOSIS — R1011 Right upper quadrant pain: Secondary | ICD-10-CM | POA: Insufficient documentation

## 2014-03-06 DIAGNOSIS — K920 Hematemesis: Secondary | ICD-10-CM | POA: Insufficient documentation

## 2014-03-06 DIAGNOSIS — R112 Nausea with vomiting, unspecified: Secondary | ICD-10-CM | POA: Insufficient documentation

## 2014-03-06 DIAGNOSIS — Z8619 Personal history of other infectious and parasitic diseases: Secondary | ICD-10-CM | POA: Insufficient documentation

## 2014-03-06 LAB — CBC WITH DIFFERENTIAL/PLATELET
BASOS ABS: 0 10*3/uL (ref 0.0–0.1)
Basophils Relative: 0 % (ref 0–1)
EOS ABS: 0.1 10*3/uL (ref 0.0–0.7)
EOS PCT: 2 % (ref 0–5)
HEMATOCRIT: 36.6 % (ref 36.0–46.0)
Hemoglobin: 12.8 g/dL (ref 12.0–15.0)
LYMPHS PCT: 49 % — AB (ref 12–46)
Lymphs Abs: 3.3 10*3/uL (ref 0.7–4.0)
MCH: 28.1 pg (ref 26.0–34.0)
MCHC: 35 g/dL (ref 30.0–36.0)
MCV: 80.4 fL (ref 78.0–100.0)
MONO ABS: 0.4 10*3/uL (ref 0.1–1.0)
Monocytes Relative: 6 % (ref 3–12)
Neutro Abs: 2.8 10*3/uL (ref 1.7–7.7)
Neutrophils Relative %: 42 % — ABNORMAL LOW (ref 43–77)
Platelets: 378 10*3/uL (ref 150–400)
RBC: 4.55 MIL/uL (ref 3.87–5.11)
RDW: 12.8 % (ref 11.5–15.5)
WBC: 6.6 10*3/uL (ref 4.0–10.5)

## 2014-03-06 LAB — COMPREHENSIVE METABOLIC PANEL
ALBUMIN: 4 g/dL (ref 3.5–5.2)
ALT: 17 U/L (ref 0–35)
AST: 23 U/L (ref 0–37)
Alkaline Phosphatase: 99 U/L (ref 39–117)
BUN: 8 mg/dL (ref 6–23)
CHLORIDE: 102 meq/L (ref 96–112)
CO2: 23 mEq/L (ref 19–32)
Calcium: 9.2 mg/dL (ref 8.4–10.5)
Creatinine, Ser: 0.49 mg/dL — ABNORMAL LOW (ref 0.50–1.10)
GFR calc Af Amer: >90 mL/min (ref >90)
GFR calc non Af Amer: >90 mL/min (ref >90)
GLUCOSE: 80 mg/dL (ref 70–99)
Potassium: 3.9 mEq/L (ref 3.7–5.3)
Sodium: 139 mEq/L (ref 137–147)
Total Bilirubin: 0.4 mg/dL (ref 0.3–1.2)
Total Protein: 7.9 g/dL (ref 6.0–8.3)

## 2014-03-06 LAB — TYPE AND SCREEN
ABO/RH(D): O POS
Antibody Screen: NEGATIVE

## 2014-03-06 LAB — LIPASE, BLOOD: LIPASE: 16 U/L (ref 11–59)

## 2014-03-06 LAB — ABO/RH: ABO/RH(D): O POS

## 2014-03-06 MED ORDER — MORPHINE SULFATE 4 MG/ML IJ SOLN
4.0000 mg | Freq: Once | INTRAMUSCULAR | Status: AC
Start: 2014-03-06 — End: 2014-03-06
  Administered 2014-03-06: 4 mg via INTRAVENOUS
  Filled 2014-03-06: qty 1

## 2014-03-06 MED ORDER — SODIUM CHLORIDE 0.9 % IV BOLUS (SEPSIS)
1000.0000 mL | Freq: Once | INTRAVENOUS | Status: AC
  Administered 2014-03-06: 1000 mL via INTRAVENOUS

## 2014-03-06 MED ORDER — PANTOPRAZOLE SODIUM 20 MG PO TBEC: 20.0000 mg | DELAYED_RELEASE_TABLET | Freq: Every day | ORAL | Status: DC

## 2014-03-06 MED ORDER — PANTOPRAZOLE SODIUM 40 MG IV SOLR
40.0000 mg | Freq: Once | INTRAVENOUS | Status: AC
Start: 2014-03-06 — End: 2014-03-06
  Administered 2014-03-06: 40 mg via INTRAVENOUS
  Filled 2014-03-06: qty 40

## 2014-03-06 MED ORDER — ONDANSETRON HCL 4 MG/2ML IJ SOLN
4.0000 mg | Freq: Once | INTRAMUSCULAR | Status: AC
  Administered 2014-03-06: 4 mg via INTRAVENOUS
  Filled 2014-03-06: qty 2
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
## 2014-03-06 NOTE — ED Notes (Signed)
While bringing pt back to room from lobby she stated she was feeling some chest pressure that started in the middle of her chest and went to her right shoulder. Pt stated it started while sitting the the lobby. PT placed on monitor once in room and EKG was obtained and shown to Dr Dorna Mai.

## 2014-03-06 NOTE — Discharge Instructions (Signed)
Read the information below.  Use the prescribed medication as directed.  Please discuss all new medications with your pharmacist.  You may return to the Emergency Department at any time for worsening condition or any new symptoms that concern you.  If you develop high fevers, abdominal pain, uncontrolled vomiting, increased bloody vomit, or are unable to tolerate fluids by mouth, return to the ER for a recheck.    Hematemesis This condition is the vomiting of blood. CAUSES  This can happen if you have a peptic ulcer or an irritation of the throat, stomach, or small bowel. Vomiting over and over again or swallowing blood from a nosebleed, coughing or facial injury can also result in bloody vomit. Anti-inflammatory pain medicines are a common cause of this potentially dangerous condition. The most serious causes of vomiting blood include:  Ulcers (a bacteria called H. pylori is common cause of ulcers).  Clotting problems.  Alcoholism.  Cirrhosis. TREATMENT  Treatment depends on the cause and the severity of the bleeding. Small amounts of blood streaks in the vomit is not the same as vomiting large amounts of bloody or dark, coffee grounds-like material. Weakness, fainting, dehydration, anemia, and continued alcohol or drug use increase the risk. Examination may include blood, vomit, or stool tests. The presence of bloody or dark stool that tests positive for blood (Hemoccult) means the bleeding has been going on for some time. Endoscopy and imaging studies may be done. Emergency treatment may include:  IV medicines or fluids.  Blood transfusions.  Surgery. Hospital care is required for high risk patients or when IV fluids or blood is needed. Upper GI bleeding can cause shock and death if not controlled. HOME CARE INSTRUCTIONS   Your treatment does not require hospital care at this time.  Remain at rest until your condition improves.  Drink clear liquids as  tolerated.  Avoid:  Alcohol.  Nicotine.  Aspirin.  Any other anti-inflammatory medicine (ibuprofen, naproxen, and many others).  Medications to suppress stomach acid or vomiting may be needed. Take all your medicine as prescribed.  Be sure to see your caregiver for follow-up as recommended. SEEK IMMEDIATE MEDICAL CARE IF:   You have repeated vomiting, dehydration, fainting, or extreme weakness.  You are vomiting large amounts of bloody or dark material.  You pass large, dark or bloody stools. Document Released: 01/08/2005 Document Revised: 02/23/2012 Document Reviewed: 01/24/2009 [facility] Patient Information 2014 [employer].

## 2014-03-06 NOTE — ED Notes (Signed)
Patient transported to X-ray 

## 2014-03-06 NOTE — ED Notes (Signed)
Pt reports vomiting blood today around 10 am. Now complains of headache. Reports vomited 2 times.

## 2014-03-10 NOTE — ED Provider Notes (Signed)
Medical screening examination/treatment/procedure(s) were performed by non-physician practitioner and as supervising physician I was immediately available for consultation/collaboration.   EKG Interpretation   Date/Time:  Monday March 06 2014 13:36:54 EDT Ventricular Rate:  67 PR Interval:  179 QRS Duration: 84 QT Interval:  369 QTC Calculation: 389 R Axis:   81 Text Interpretation:  Sinus rhythm Nonspecific T wave abnormality  Borderline ECG No previous tracing Confirmed by [facility]  MD, MICHEAL ([id number])  on 03/06/2014 2:39:31 PM        Saddie Benders. Kavitha Lansdale, MD 03/10/14 2351

## 2014-04-06 ENCOUNTER — Encounter: Payer: Self-pay | Admitting: Gastroenterology

## 2014-04-24 ENCOUNTER — Ambulatory Visit: Payer: 59 | Admitting: Internal Medicine

## 2014-08-31 ENCOUNTER — Ambulatory Visit (INDEPENDENT_AMBULATORY_CARE_PROVIDER_SITE_OTHER): Payer: 59 | Admitting: Women's Health

## 2014-08-31 ENCOUNTER — Other Ambulatory Visit (HOSPITAL_COMMUNITY)
Admission: RE | Admit: 2014-08-31 | Discharge: 2014-08-31 | Disposition: A | Discharge status: Home / Self Care | Payer: 59 | Source: Ambulatory Visit | Attending: Gynecology | Admitting: Gynecology

## 2014-08-31 ENCOUNTER — Encounter: Payer: Self-pay | Admitting: Women's Health

## 2014-08-31 VITALS — BP 134/88 | Ht 63.5 in | Wt 244.0 lb

## 2014-08-31 DIAGNOSIS — Z1151 Encounter for screening for human papillomavirus (HPV): Secondary | ICD-10-CM | POA: Diagnosis present

## 2014-08-31 DIAGNOSIS — Z124 Encounter for screening for malignant neoplasm of cervix: Secondary | ICD-10-CM | POA: Insufficient documentation

## 2014-08-31 DIAGNOSIS — Z13220 Encounter for screening for lipoid disorders: Secondary | ICD-10-CM

## 2014-08-31 DIAGNOSIS — R87810 Cervical high risk human papillomavirus (HPV) DNA test positive: Secondary | ICD-10-CM | POA: Insufficient documentation

## 2014-08-31 DIAGNOSIS — F4322 Adjustment disorder with anxiety: Secondary | ICD-10-CM

## 2014-08-31 DIAGNOSIS — Z01419 Encounter for gynecological examination (general) (routine) without abnormal findings: Secondary | ICD-10-CM

## 2014-08-31 MED ORDER — DIAZEPAM 5 MG PO TABS: 5.0000 mg | ORAL_TABLET | Freq: Four times a day (QID) | ORAL | Status: DC | PRN

## 2014-08-31 NOTE — Addendum Note (Signed)
Addended by: Thurnell Garbe A on: 08/31/2014 10:24 AM   Modules accepted: Orders

## 2014-08-31 NOTE — Addendum Note (Signed)
Addended by: Thurnell Garbe A on: 08/31/2014 09:45 AM   Modules accepted: Orders

## 2014-08-31 NOTE — Patient Instructions (Signed)
Levonorgestrel intrauterine device (IUD) What is this medicine? LEVONORGESTREL IUD (LEE voe nor jes trel) is a contraceptive (birth control) device. The device is placed inside the uterus by a healthcare professional. It is used to prevent pregnancy and can also be used to treat heavy bleeding that occurs during your period. Depending on the device, it can be used for 3 to 5 years. This medicine may be used for other purposes; ask your health care provider or pharmacist if you have questions. COMMON BRAND NAME(S): LILETTA, Mirena, Skyla What should I tell my health care provider before I take this medicine? They need to know if you have any of these conditions: -abnormal Pap smear -cancer of the breast, uterus, or cervix -diabetes -endometritis -genital or pelvic infection now or in the past -have more than one sexual partner or your partner has more than one partner -heart disease -history of an ectopic or tubal pregnancy -immune system problems -IUD in place -liver disease or tumor -problems with blood clots or take blood-thinners -use intravenous drugs -uterus of unusual shape -vaginal bleeding that has not been explained -an unusual or allergic reaction to levonorgestrel, other hormones, silicone, or polyethylene, medicines, foods, dyes, or preservatives -pregnant or trying to get pregnant -breast-feeding How should I use this medicine? This device is placed inside the uterus by a health care professional. Talk to your pediatrician regarding the use of this medicine in children. Special care may be needed. Overdosage: If you think you have taken too much of this medicine contact a poison control center or emergency room at once. NOTE: This medicine is only for you. Do not share this medicine with others. What if I miss a dose? This does not apply. What may interact with this medicine? Do not take this medicine with any of the following  medications: -amprenavir -bosentan -fosamprenavir This medicine may also interact with the following medications: -aprepitant -barbiturate medicines for inducing sleep or treating seizures -bexarotene -griseofulvin -medicines to treat seizures like carbamazepine, ethotoin, felbamate, oxcarbazepine, phenytoin, topiramate -modafinil -pioglitazone -rifabutin -rifampin -rifapentine -some medicines to treat HIV infection like atazanavir, indinavir, lopinavir, nelfinavir, tipranavir, ritonavir -St. John's wort -warfarin This list may not describe all possible interactions. Give your health care provider a list of all the medicines, herbs, non-prescription drugs, or dietary supplements you use. Also tell them if you smoke, drink alcohol, or use illegal drugs. Some items may interact with your medicine. What should I watch for while using this medicine? Visit your doctor or health care professional for regular check ups. See your doctor if you or your partner has sexual contact with others, becomes HIV positive, or gets a sexual transmitted disease. This product does not protect you against HIV infection (AIDS) or other sexually transmitted diseases. You can check the placement of the IUD yourself by reaching up to the top of your vagina with clean fingers to feel the threads. Do not pull on the threads. It is a good habit to check placement after each menstrual period. Call your doctor right away if you feel more of the IUD than just the threads or if you cannot feel the threads at all. The IUD may come out by itself. You may become pregnant if the device comes out. If you notice that the IUD has come out use a backup birth control method like condoms and call your health care provider. Using tampons will not change the position of the IUD and are okay to use during your period. What side effects may   I notice from receiving this medicine? Side effects that you should report to your doctor or  health care professional as soon as possible: -allergic reactions like skin rash, itching or hives, swelling of the face, lips, or tongue -fever, flu-like symptoms -genital sores -high blood pressure -no menstrual period for 6 weeks during use -pain, swelling, warmth in the leg -pelvic pain or tenderness -severe or sudden headache -signs of pregnancy -stomach cramping -sudden shortness of breath -trouble with balance, talking, or walking -unusual vaginal bleeding, discharge -yellowing of the eyes or skin Side effects that usually do not require medical attention (report to your doctor or health care professional if they continue or are bothersome): -acne -breast pain -change in sex drive or performance -changes in weight -cramping, dizziness, or faintness while the device is being inserted -headache -irregular menstrual bleeding within first 3 to 6 months of use -nausea This list may not describe all possible side effects. Call your doctor for medical advice about side effects. You may report side effects to FDA at 1-800-FDA-1088. Where should I keep my medicine? This does not apply. NOTE: This sheet is a summary. It may not cover all possible information. If you have questions about this medicine, talk to your doctor, pharmacist, or health care provider.  2015, Elsevier/Gold Standard. (2012-01-01 13:54:04)  

## 2014-08-31 NOTE — Progress Notes (Signed)
Lynn Hansen 11-Mar-1976 [id number]    History:    Presents for annual exam.  Regular monthly cycle/condoms/same partner. 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 Normal Paps after.  Past medical history, past surgical history, family history and social history were all reviewed and documented in the EPIC chart. Works for [employer]. Tashia 14 doing well. Adopted.  ROS:  A  12 point ROS was performed and pertinent positives and negatives are included.  Exam:  Filed Vitals:   08/31/14 0817  BP: 134/88    General appearance:  Normal Thyroid:  Symmetrical, normal in size, without palpable masses or nodularity. Respiratory  Auscultation:  Clear without wheezing or rhonchi Cardiovascular  Auscultation:  Regular rate, without rubs, murmurs or gallops  Edema/varicosities:  Not grossly evident Abdominal  Soft,nontender, without masses, guarding or rebound.  Liver/spleen:  No organomegaly noted  Hernia:  None appreciated  Skin  Inspection:  Grossly normal   Breasts: Examined lying and sitting.     Right: Without masses, retractions, discharge or axillary adenopathy.     Left: Without masses, retractions, discharge or axillary adenopathy. Gentitourinary   Inguinal/mons:  Normal without inguinal adenopathy  External genitalia:  Normal  BUS/Urethra/Skene's glands:  Normal  Vagina:  Normal  Cervix:  Normal  Uterus:   normal in size, shape and contour.  Midline and mobile  Adnexa/parametria:     Rt: Without masses or tenderness.   Lt: Without masses or tenderness.  Anus and perineum: Normal  Digital rectal exam: Normal sphincter tone without palpated masses or tenderness  Assessment/Plan:  37 y.o. SBF G1P1 for annual exam.     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 Valium 5 mg prior to Mirena IUD placement with Dr. Phineas Real, will have someone drive her. SBE's, regular exercise, decrease calories, Weight Watchers reviewed, calcium rich diet, vitamin D 1000 daily encouraged. Lipid panel, UA, Pap with high-risk HPV typing. New screening guidelines reviewed. Normal CBC, comprehensive metabolic panel in 6 months ago.  Note: This dictation was prepared with Dragon/digital dictation.  Any transcriptional errors that result are unintentional. Huel Cote [facility], 9:20 AM 08/31/2014

## 2014-09-05 ENCOUNTER — Telehealth: Payer: Self-pay | Admitting: Gynecology

## 2014-09-05 LAB — CYTOLOGY - PAP

## 2014-09-05 NOTE — Telephone Encounter (Signed)
09/05/14-I LM VM for pt that her [facility] insurance will cover the Mirena and insertion at 100%, no copay for contraception. She was advised to call when cycle starts to schedule insertion with [email or web address]

## 2014-09-11 ENCOUNTER — Other Ambulatory Visit: Payer: Self-pay | Admitting: Gynecology

## 2014-09-11 DIAGNOSIS — Z3049 Encounter for surveillance of other contraceptives: Secondary | ICD-10-CM

## 2014-09-11 MED ORDER — LEVONORGESTREL 20 MCG/24HR IU IUD: INTRAUTERINE_SYSTEM | Freq: Once | INTRAUTERINE | Status: DC

## 2014-10-05 ENCOUNTER — Ambulatory Visit: Payer: 59 | Admitting: Gynecology

## 2014-10-11 ENCOUNTER — Other Ambulatory Visit: Payer: Self-pay

## 2014-10-11 DIAGNOSIS — Z1231 Encounter for screening mammogram for malignant neoplasm of breast: Secondary | ICD-10-CM

## 2014-10-16 ENCOUNTER — Encounter: Payer: Self-pay | Admitting: Women's Health

## 2014-10-26 ENCOUNTER — Inpatient Hospital Stay: Admission: RE | Admit: 2014-10-26 | Payer: 59 | Source: Ambulatory Visit

## 2014-12-12 ENCOUNTER — Encounter: Payer: Self-pay | Admitting: Women's Health

## 2014-12-12 ENCOUNTER — Ambulatory Visit (INDEPENDENT_AMBULATORY_CARE_PROVIDER_SITE_OTHER): Payer: 59 | Admitting: Women's Health

## 2014-12-12 VITALS — BP 128/80 | Ht 63.0 in | Wt 244.0 lb

## 2014-12-12 DIAGNOSIS — N898 Other specified noninflammatory disorders of vagina: Secondary | ICD-10-CM

## 2014-12-12 DIAGNOSIS — B3731 Acute candidiasis of vulva and vagina: Secondary | ICD-10-CM

## 2014-12-12 DIAGNOSIS — B373 Candidiasis of vulva and vagina: Secondary | ICD-10-CM

## 2014-12-12 LAB — WET PREP FOR TRICH, YEAST, CLUE
CLUE CELLS WET PREP: NONE SEEN
TRICH WET PREP: NONE SEEN

## 2014-12-12 MED ORDER — FLUCONAZOLE 150 MG PO TABS: 150.0000 mg | ORAL_TABLET | Freq: Once | ORAL | Status: DC

## 2014-12-12 NOTE — Progress Notes (Signed)
Patient ID: Lynn Hansen, female   DOB: 1976/03/01, 38 y.o.   MRN: [id number] Presents with vaginal discharge with itching for several days. Denies abdominal pain, fever, urinary symptoms. Had been on an antibiotic for a sinus infection. Regular monthly cycle/condoms/same partner.  Exam: Appears well. External genitalia slightly erythematous at introitus, speculum exam moderate amount of a white discharge no odor noted, wet prep positive for yeast. Bimanual limited due to obesity, nontender.  Yeast vaginitis  Plan: Diflucan 1:50 PM today repeat in 3 days if needed. Call if no relief of discharge with itching. Yeast prevention discussed.

## 2014-12-12 NOTE — Patient Instructions (Signed)

## 2014-12-14 LAB — URINALYSIS W MICROSCOPIC + REFLEX CULTURE
BILIRUBIN URINE: NEGATIVE
CRYSTALS: NONE SEEN
Casts: NONE SEEN
GLUCOSE, UA: NEGATIVE mg/dL
Hgb urine dipstick: NEGATIVE
Ketones, ur: NEGATIVE mg/dL
Leukocytes, UA: NEGATIVE
Nitrite: NEGATIVE
Protein, ur: NEGATIVE mg/dL
SPECIFIC GRAVITY, URINE: 1.026 (ref 1.005–1.030)
Urobilinogen, UA: 1 mg/dL (ref 0.0–1.0)
pH: 6.5 (ref 5.0–8.0)

## 2014-12-16 LAB — URINE CULTURE

## 2014-12-20 ENCOUNTER — Other Ambulatory Visit: Payer: Self-pay | Admitting: Women's Health

## 2014-12-20 MED ORDER — CIPROFLOXACIN HCL 250 MG PO TABS: 250.0000 mg | ORAL_TABLET | Freq: Two times a day (BID) | ORAL | Status: DC

## 2015-02-09 ENCOUNTER — Ambulatory Visit: Payer: Self-pay | Admitting: Women's Health

## 2015-02-14 ENCOUNTER — Encounter: Payer: Self-pay | Admitting: Women's Health

## 2015-02-14 ENCOUNTER — Ambulatory Visit (INDEPENDENT_AMBULATORY_CARE_PROVIDER_SITE_OTHER): Payer: 59 | Admitting: Women's Health

## 2015-02-14 VITALS — BP 124/88 | Ht 62.0 in | Wt 245.0 lb

## 2015-02-14 DIAGNOSIS — B373 Candidiasis of vulva and vagina: Secondary | ICD-10-CM | POA: Diagnosis not present

## 2015-02-14 DIAGNOSIS — Z30011 Encounter for initial prescription of contraceptive pills: Secondary | ICD-10-CM | POA: Diagnosis not present

## 2015-02-14 DIAGNOSIS — N760 Acute vaginitis: Secondary | ICD-10-CM | POA: Diagnosis not present

## 2015-02-14 DIAGNOSIS — A499 Bacterial infection, unspecified: Secondary | ICD-10-CM | POA: Diagnosis not present

## 2015-02-14 DIAGNOSIS — N898 Other specified noninflammatory disorders of vagina: Secondary | ICD-10-CM

## 2015-02-14 DIAGNOSIS — B3731 Acute candidiasis of vulva and vagina: Secondary | ICD-10-CM

## 2015-02-14 DIAGNOSIS — B9689 Other specified bacterial agents as the cause of diseases classified elsewhere: Secondary | ICD-10-CM | POA: Insufficient documentation

## 2015-02-14 LAB — URINALYSIS W MICROSCOPIC + REFLEX CULTURE
Bilirubin Urine: NEGATIVE
GLUCOSE, UA: NEGATIVE mg/dL
Hgb urine dipstick: NEGATIVE
Ketones, ur: NEGATIVE mg/dL
LEUKOCYTES UA: NEGATIVE
NITRITE: NEGATIVE
PH: 7 (ref 5.0–8.0)
Specific Gravity, Urine: 1.02 (ref 1.005–1.030)
Urobilinogen, UA: 2 mg/dL — ABNORMAL HIGH (ref 0.0–1.0)

## 2015-02-14 LAB — WET PREP FOR TRICH, YEAST, CLUE: Trich, Wet Prep: NONE SEEN

## 2015-02-14 MED ORDER — METRONIDAZOLE 0.75 % VA GEL: VAGINAL | Status: DC

## 2015-02-14 MED ORDER — FLUCONAZOLE 150 MG PO TABS: 150.0000 mg | ORAL_TABLET | Freq: Once | ORAL | Status: DC

## 2015-02-14 MED ORDER — NORETHINDRONE 0.35 MG PO TABS: 1.0000 | ORAL_TABLET | Freq: Every day | ORAL | Status: DC

## 2015-02-14 NOTE — Patient Instructions (Signed)
Bacterial Vaginosis Bacterial vaginosis is an infection of the vagina. It happens when too many of certain germs (bacteria) grow in the vagina. HOME CARE  Take your medicine as told by your doctor.  Finish your medicine even if you start to feel better.  Do not have sex until you finish your medicine and are better.  Tell your sex partner that you have an infection. They should see their doctor for treatment.  Practice safe sex. Use condoms. Have only one sex partner. GET HELP IF:  You are not getting better after 3 days of treatment.  You have more grey fluid (discharge) coming from your vagina than before.  You have more pain than before.  You have a fever. MAKE SURE YOU:   Understand these instructions.  Will watch your condition.  Will get help right away if you are not doing well or get worse. Document Released: 09/09/2008 Document Revised: 09/21/2013 Document Reviewed: 07/13/2013 ExitCare Patient Information 2015 [employer]. This information is not intended to replace advice given to you by your health care provider. Make sure you discuss any questions you have with your health care provider.  

## 2015-02-14 NOTE — Progress Notes (Signed)
Patient ID: Lynn Hansen, female   DOB: [birth year], 39 y.o.   MRN: [id number] Presents with complaint of increased vaginal discharge with itching, irritation without odor for 1 week. Denies abdominal pain, fever or urinary symptoms other than occasional frequency. Monthly cycle/condoms/same partner. Has had problems with recurrent bacteria vaginosis.  Exam: Appears well, obese. External genitalia mild erythema at introitus, speculum exam moderate milky discharge with odor noted. Wet prep positive for amines, clues, TNTC bacteria and yeast. Bimanual no CMT or adnexal fullness or tenderness. UA: Trace protein, leukocytes negative.  Recurrent Bacteria vaginosis Yeast vaginitis  Plan: MetroGel vaginal cream 1 applicator at bedtime 5, one applicator weekly for several weeks and then monthly as needed. Diflucan 150 by mouth 1 dose, call if no relief of symptoms.

## 2015-02-23 ENCOUNTER — Telehealth: Payer: Self-pay | Admitting: *Deleted

## 2015-02-23 MED ORDER — METRONIDAZOLE 500 MG PO TABS: 500.0000 mg | ORAL_TABLET | Freq: Two times a day (BID) | ORAL | Status: DC

## 2015-02-23 NOTE — Telephone Encounter (Signed)
Yikes, flagyl 500 bid for 7 #14 should only be about $4 , let her know we don't know price of meds with generics and insurance.

## 2015-02-23 NOTE — Telephone Encounter (Signed)
Left on voicemail Rx sent 

## 2015-02-23 NOTE — Telephone Encounter (Signed)
Pt aware you are out of the office) pt said the Metrogel prescribed on 02/14/15 was $100 asked if you could give her the pill form of medication? Please advise

## 2015-05-15 ENCOUNTER — Encounter: Payer: Self-pay | Admitting: Women's Health

## 2015-05-15 ENCOUNTER — Ambulatory Visit (INDEPENDENT_AMBULATORY_CARE_PROVIDER_SITE_OTHER): Payer: 59 | Admitting: Women's Health

## 2015-05-15 VITALS — BP 128/80 | Ht 63.0 in | Wt 239.0 lb

## 2015-05-15 DIAGNOSIS — R350 Frequency of micturition: Secondary | ICD-10-CM

## 2015-05-15 DIAGNOSIS — A499 Bacterial infection, unspecified: Secondary | ICD-10-CM

## 2015-05-15 DIAGNOSIS — B9689 Other specified bacterial agents as the cause of diseases classified elsewhere: Secondary | ICD-10-CM

## 2015-05-15 DIAGNOSIS — N760 Acute vaginitis: Secondary | ICD-10-CM

## 2015-05-15 LAB — WET PREP FOR TRICH, YEAST, CLUE
TRICH WET PREP: NONE SEEN
WBC WET PREP: NONE SEEN
YEAST WET PREP: NONE SEEN

## 2015-05-15 MED ORDER — METRONIDAZOLE 500 MG PO TABS: 500.0000 mg | ORAL_TABLET | Freq: Two times a day (BID) | ORAL | Status: DC

## 2015-05-15 NOTE — Addendum Note (Signed)
Addended by: Burnett Kanaris on: 05/15/2015 03:39 PM   Modules accepted: Orders

## 2015-05-15 NOTE — Patient Instructions (Signed)
Bacterial Vaginosis Bacterial vaginosis is a vaginal infection that occurs when the normal balance of bacteria in the vagina is disrupted. It results from an overgrowth of certain bacteria. This is the most common vaginal infection in women of childbearing age. Treatment is important to prevent complications, especially in pregnant women, as it can cause a premature delivery. CAUSES  Bacterial vaginosis is caused by an increase in harmful bacteria that are normally present in smaller amounts in the vagina. Several different kinds of bacteria can cause bacterial vaginosis. However, the reason that the condition develops is not fully understood. RISK FACTORS Certain activities or behaviors can put you at an increased risk of developing bacterial vaginosis, including:  Having a new sex partner or multiple sex partners.  Douching.  Using an intrauterine device (IUD) for contraception. Women do not get bacterial vaginosis from toilet seats, bedding, swimming pools, or contact with objects around them. SIGNS AND SYMPTOMS  Some women with bacterial vaginosis have no signs or symptoms. Common symptoms include:  Grey vaginal discharge.  A fishlike odor with discharge, especially after sexual intercourse.  Itching or burning of the vagina and vulva.  Burning or pain with urination. DIAGNOSIS  Your health care provider will take a medical history and examine the vagina for signs of bacterial vaginosis. A sample of vaginal fluid may be taken. Your health care provider will look at this sample under a microscope to check for bacteria and abnormal cells. A vaginal pH test may also be done.  TREATMENT  Bacterial vaginosis may be treated with antibiotic medicines. These may be given in the form of a pill or a vaginal cream. A second round of antibiotics may be prescribed if the condition comes back after treatment.  HOME CARE INSTRUCTIONS   Only take over-the-counter or prescription medicines as  directed by your health care provider.  If antibiotic medicine was prescribed, take it as directed. Make sure you finish it even if you start to feel better.  Do not have sex until treatment is completed.  Tell all sexual partners that you have a vaginal infection. They should see their health care provider and be treated if they have problems, such as a mild rash or itching.  Practice safe sex by using condoms and only having one sex partner. SEEK MEDICAL CARE IF:   Your symptoms are not improving after 3 days of treatment.  You have increased discharge or pain.  You have a fever. MAKE SURE YOU:   Understand these instructions.  Will watch your condition.  Will get help right away if you are not doing well or get worse. FOR MORE INFORMATION  Centers for Disease Control and Prevention, Division of STD Prevention: www.cdc.gov/std American Sexual Health Association (ASHA): www.ashastd.org  Document Released: 12/01/2005 Document Revised: 09/21/2013 Document Reviewed: 07/13/2013 ExitCare Patient Information 2015 [employer]. This information is not intended to replace advice given to you by your health care provider. Make sure you discuss any questions you have with your health care provider.  

## 2015-05-15 NOTE — Progress Notes (Signed)
Patient ID: Lynn Hansen, female   DOB: 1976-05-01, 39 y.o.   MRN: [id number] Presents with complaint of increased vaginal discharge with mild irritation and odor. 02/2015 had BV, prescribed Flagyl tablets, did not complete course, misplaced Rx.  Denies urinary urinary symptoms, abdominal pain or fever. Monthly cycle/condoms/same partner.  Exam: Appears well. External genitalia within normal limits, speculum exam moderate amount of a pink discharge with odor, wet prep positive for amines, clues, TNTC bacteria. Bimanual no CMT or adnexal fullness or tenderness.  Bacteria vaginosis  Plan: Flagyl 500 twice daily for 7 days, alcohol precautions reviewed, reviewed importance of completing prescription.

## 2015-05-16 LAB — URINALYSIS W MICROSCOPIC + REFLEX CULTURE
Bilirubin Urine: NEGATIVE
CASTS: NONE SEEN
CRYSTALS: NONE SEEN
Glucose, UA: NEGATIVE mg/dL
Ketones, ur: NEGATIVE mg/dL
LEUKOCYTES UA: NEGATIVE
Nitrite: NEGATIVE
PH: 5.5 (ref 5.0–8.0)
Protein, ur: NEGATIVE mg/dL
Specific Gravity, Urine: 1.01 (ref 1.005–1.030)
Urobilinogen, UA: 1 mg/dL (ref 0.0–1.0)

## 2015-05-28 ENCOUNTER — Ambulatory Visit (INDEPENDENT_AMBULATORY_CARE_PROVIDER_SITE_OTHER): Payer: 59 | Admitting: Gynecology

## 2015-05-28 ENCOUNTER — Encounter: Payer: Self-pay | Admitting: Gynecology

## 2015-05-28 VITALS — BP 136/88

## 2015-05-28 DIAGNOSIS — Z113 Encounter for screening for infections with a predominantly sexual mode of transmission: Secondary | ICD-10-CM

## 2015-05-28 DIAGNOSIS — N898 Other specified noninflammatory disorders of vagina: Secondary | ICD-10-CM | POA: Diagnosis not present

## 2015-05-28 DIAGNOSIS — B373 Candidiasis of vulva and vagina: Secondary | ICD-10-CM | POA: Diagnosis not present

## 2015-05-28 DIAGNOSIS — B3731 Acute candidiasis of vulva and vagina: Secondary | ICD-10-CM

## 2015-05-28 LAB — WET PREP FOR TRICH, YEAST, CLUE
Clue Cells Wet Prep HPF POC: NONE SEEN
Trich, Wet Prep: NONE SEEN

## 2015-05-28 MED ORDER — FLUCONAZOLE 150 MG PO TABS: ORAL_TABLET | ORAL | Status: DC

## 2015-05-28 NOTE — Patient Instructions (Signed)
Fluconazole injection What is this medicine? FLUCONAZOLE (floo KON na zole) is an antifungal medicine. It is used to treat or prevent certain kinds of fungal or yeast infections. This medicine may be used for other purposes; ask your health care provider or pharmacist if you have questions. COMMON BRAND NAME(S): Diflucan What should I tell my health care provider before I take this medicine? They need to know if you have any of these conditions: -history of irregular heart beat -kidney disease -an unusual or allergic reaction to fluconazole, other antifungal medicines, foods, dyes or preservatives -pregnant or trying to get pregnant -breast-feeding How should I use this medicine? This medicine is for injection into a vein. It is usually given by a health care professional in a hospital or clinic setting. If you get this medicine at home, you will be taught how to prepare and give this medicine. Use exactly as directed. Take your medicine at regular intervals. Do not take your medicine more often than directed. It is important that you put your used needles and syringes in a special sharps container. Do not put them in a trash can. If you do not have a sharps container, call your pharmacist or healthcare provider to get one. Talk to your pediatrician regarding the use of this medicine in children. Special care may be needed. Overdosage: If you think you have taken too much of this medicine contact a poison control center or emergency room at once. NOTE: This medicine is only for you. Do not share this medicine with others. What if I miss a dose? This does not apply. What may interact with this medicine? Do not take this medicine with any of the following medications: -cisapride -pimozide -red yeast rice This medicine may also interact with the following medications: -birth control pills -cyclosporine -diuretics like hydrochlorothiazide -medicines for diabetes that are taken by  mouth -medicines for high cholesterol like atorvastatin, lovastatin or simvastatin -phenytoin -ramelteon -rifabutin -rifampin -some medicines for anxiety or sleep -tacrolimus -terfenadine -theophylline -tofacitinib -warfarin This list may not describe all possible interactions. Give your health care provider a list of all the medicines, herbs, non-prescription drugs, or dietary supplements you use. Also tell them if you smoke, drink alcohol, or use illegal drugs. Some items may interact with your medicine. What should I watch for while using this medicine? Tell your doctor if your symptoms do not improve. If you are taking this medicine for a long time you may need blood work. Some fungal infections need many weeks or months of treatment to cure completely. Alcohol can increase possible damage to your liver from this medicine. Avoid alcoholic drinks. What side effects may I notice from receiving this medicine? Side effects that you should report to your doctor or health care professional as soon as possible: -allergic reactions like skin rash or itching, hives, swelling of the lips, mouth, tongue, or throat -dark urine -feeling dizzy or faint -irregular heartbeat or chest pain -pain, redness at site of injection -redness, blistering, peeling or loosening of the skin, including inside the mouth -stomach pain -trouble breathing -unusual bruising or bleeding -vomiting -yellowing of the eyes or skin Side effects that usually do not require medical attention (report to your doctor or health care professional if they continue or are bothersome): -changes in how food tastes -diarrhea -headache -stomach upset, nausea This list may not describe all possible side effects. Call your doctor for medical advice about side effects. You may report side effects to FDA at 1-800-FDA-1088. Where should  I keep my medicine? Keep out of the reach of children. If you are using this medicine at home, you  will be instructed on how to store this medicine. Throw away any unused medicine after the expiration date on the label. NOTE: This sheet is a summary. It may not cover all possible information. If you have questions about this medicine, talk to your doctor, pharmacist, or health care provider.  2015, Elsevier/Gold Standard. (2013-07-09 15:51:41) Monilial Vaginitis Vaginitis in a soreness, swelling and redness (inflammation) of the vagina and vulva. Monilial vaginitis is not a sexually transmitted infection. CAUSES  Yeast vaginitis is caused by yeast (candida) that is normally found in your vagina. With a yeast infection, the candida has overgrown in number to a point that upsets the chemical balance. SYMPTOMS   White, thick vaginal discharge.  Swelling, itching, redness and irritation of the vagina and possibly the lips of the vagina (vulva).  Burning or painful urination.  Painful intercourse. DIAGNOSIS  Things that may contribute to monilial vaginitis are:  Postmenopausal and virginal states.  Pregnancy.  Infections.  Being tired, sick or stressed, especially if you had monilial vaginitis in the past.  Diabetes. Good control will help lower the chance.  Birth control pills.  Tight fitting garments.  Using bubble bath, feminine sprays, douches or deodorant tampons.  Taking certain medications that kill germs (antibiotics).  Sporadic recurrence can occur if you become ill. TREATMENT  Your caregiver will give you medication.  There are several kinds of anti monilial vaginal creams and suppositories specific for monilial vaginitis. For recurrent yeast infections, use a suppository or cream in the vagina 2 times a week, or as directed.  Anti-monilial or steroid cream for the itching or irritation of the vulva may also be used. Get your caregiver's permission.  Painting the vagina with methylene blue solution may help if the monilial cream does not work.  Eating yogurt  may help prevent monilial vaginitis. HOME CARE INSTRUCTIONS   Finish all medication as prescribed.  Do not have sex until treatment is completed or after your caregiver tells you it is okay.  Take warm sitz baths.  Do not douche.  Do not use tampons, especially scented ones.  Wear cotton underwear.  Avoid tight pants and panty hose.  Tell your sexual partner that you have a yeast infection. They should go to their caregiver if they have symptoms such as mild rash or itching.  Your sexual partner should be treated as well if your infection is difficult to eliminate.  Practice safer sex. Use condoms.  Some vaginal medications cause latex condoms to fail. Vaginal medications that harm condoms are:  Cleocin cream.  Butoconazole (Femstat).  Terconazole (Terazol) vaginal suppository.  Miconazole (Monistat) (may be purchased over the counter). SEEK MEDICAL CARE IF:   You have a temperature by mouth above 102 F (38.9 C).  The infection is getting worse after 2 days of treatment.  The infection is not getting better after 3 days of treatment.  You develop blisters in or around your vagina.  You develop vaginal bleeding, and it is not your menstrual period.  You have pain when you urinate.  You develop intestinal problems.  You have pain with sexual intercourse. Document Released: 09/10/2005 Document Revised: 02/23/2012 Document Reviewed: 05/25/2009 ExitCare Patient Information 2015 [employer]. This information is not intended to replace advice given to you by your health care provider. Make sure you discuss any questions you have with your health care provider.  

## 2015-05-28 NOTE — Progress Notes (Signed)
    39 year old presented to the office today stating for the past today she's had some vaginal itching and slight discharge. Review of her record indicated the past she has been treated twice for bacterial vaginosis within 6 months. She is using condoms for contraception and hasn't had no change in sexual partners. Patient reports normal menstrual cycles.  Exam: Blood pressure 136/88 Gen. appearance well-developed well-nourished female no acute distress Pelvic exam: Bartholin urethra Skene was within normal limits Vagina: No unusual discharge or odor noted a wet prep was obtained along with a GC and Chlamydia culture Cervix: No lesions or discharge Bimanual exam not done Rectal exam: Not done  Wet prep moderate amount of hyphae noted rare WBC few bacteria  Assessment/plan: Yeast vaginitis will be treated Diflucan 150 mg one by mouth. She may repeated in 48 hours if symptoms have not improved. I've recommended also patient begin to use refresh a probiotic gel twice weekly prevent recurrent yeast and BV. GC culture pending at time of this dictation.

## 2015-05-29 LAB — GC/CHLAMYDIA PROBE AMP
CT PROBE, AMP APTIMA: NEGATIVE
GC Probe RNA: NEGATIVE

## 2015-07-11 ENCOUNTER — Other Ambulatory Visit: Payer: Self-pay

## 2015-07-12 ENCOUNTER — Telehealth: Payer: Self-pay | Admitting: *Deleted

## 2015-07-12 MED ORDER — METRONIDAZOLE 500 MG PO TABS: 500.0000 mg | ORAL_TABLET | Freq: Two times a day (BID) | ORAL | Status: DC

## 2015-07-12 NOTE — Telephone Encounter (Signed)
Pt was prescribed Flagyl 500 mg BID x 7 days on OV 05/15/15 states she went out of town and lost Rx. Pt wants refill for this. Okay to refill or OV? Please advise

## 2015-07-12 NOTE — Telephone Encounter (Signed)
Pt informed Rx sent. 

## 2015-07-12 NOTE — Telephone Encounter (Signed)
Okay, if no relief office visit

## 2015-08-10 ENCOUNTER — Encounter: Payer: Self-pay | Admitting: Family Medicine

## 2015-08-10 ENCOUNTER — Ambulatory Visit (INDEPENDENT_AMBULATORY_CARE_PROVIDER_SITE_OTHER): Payer: 59 | Admitting: Family Medicine

## 2015-08-10 VITALS — BP 138/84 | HR 64 | Ht 64.0 in | Wt 238.8 lb

## 2015-08-10 DIAGNOSIS — Z8639 Personal history of other endocrine, nutritional and metabolic disease: Secondary | ICD-10-CM

## 2015-08-10 DIAGNOSIS — Z0000 Encounter for general adult medical examination without abnormal findings: Secondary | ICD-10-CM | POA: Diagnosis not present

## 2015-08-10 DIAGNOSIS — Z23 Encounter for immunization: Secondary | ICD-10-CM | POA: Diagnosis not present

## 2015-08-10 LAB — POCT URINALYSIS DIPSTICK
BILIRUBIN UA: NEGATIVE
Blood, UA: NEGATIVE
Glucose, UA: NEGATIVE
Ketones, UA: NEGATIVE
LEUKOCYTES UA: NEGATIVE
NITRITE UA: NEGATIVE
PROTEIN UA: NEGATIVE
Spec Grav, UA: >=1.03
Urobilinogen, UA: NEGATIVE
pH, UA: 6.5
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
## 2015-08-10 NOTE — Patient Instructions (Signed)
Try downloading the free app called my fitness panel, this will help you with appropriate calorie intake. Eat small frequent meals and make healthy choices, do not skip meals. I would like for you to limit your salt intake as well, I attached the Dash plan.    DASH Eating Plan DASH stands for "Dietary Approaches to Stop Hypertension." The DASH eating plan is a healthy eating plan that has been shown to reduce high blood pressure (hypertension). Additional health benefits may include reducing the risk of type 2 diabetes mellitus, heart disease, and stroke. The DASH eating plan may also help with weight loss. WHAT DO I NEED TO KNOW ABOUT THE DASH EATING PLAN? For the DASH eating plan, you will follow these general guidelines:  Choose foods with a percent daily value for sodium of less than 5% (as listed on the food label).  Use salt-free seasonings or herbs instead of table salt or sea salt.  Check with your health care provider or pharmacist before using salt substitutes.  Eat lower-sodium products, often labeled as "lower sodium" or "no salt added."  Eat fresh foods.  Eat more vegetables, fruits, and low-fat dairy products.  Choose whole grains. Look for the word "whole" as the first word in the ingredient list.  Choose fish and skinless chicken or [place] more often than red meat. Limit fish, poultry, and meat to 6 oz (170 g) each day.  Limit sweets, desserts, sugars, and sugary drinks.  Choose heart-healthy fats.  Limit cheese to 1 oz (28 g) per day.  Eat more home-cooked food and less restaurant, buffet, and fast food.  Limit fried foods.  Cook foods using methods other than frying.  Limit canned vegetables. If you do use them, rinse them well to decrease the sodium.  When eating at a restaurant, ask that your food be prepared with less salt, or no salt if possible. WHAT FOODS CAN I EAT? Seek help from a dietitian for individual calorie needs. Grains Whole grain or whole  wheat bread. Brown rice. Whole grain or whole wheat pasta. Quinoa, bulgur, and whole grain cereals. Low-sodium cereals. Corn or whole wheat flour tortillas. Whole grain cornbread. Whole grain crackers. Low-sodium crackers. Vegetables Fresh or frozen vegetables (raw, steamed, roasted, or grilled). Low-sodium or reduced-sodium tomato and vegetable juices. Low-sodium or reduced-sodium tomato sauce and paste. Low-sodium or reduced-sodium canned vegetables.  Fruits All fresh, canned (in natural juice), or frozen fruits. Meat and Other Protein Products Ground beef (85% or leaner), grass-fed beef, or beef trimmed of fat. Skinless chicken or [place]. Ground chicken or [place]. Pork trimmed of fat. All fish and seafood. Eggs. Dried beans, peas, or lentils. Unsalted nuts and seeds. Unsalted canned beans. Dairy Low-fat dairy products, such as skim or 1% milk, 2% or reduced-fat cheeses, low-fat ricotta or cottage cheese, or plain low-fat yogurt. Low-sodium or reduced-sodium cheeses. Fats and Oils Tub margarines without trans fats. Light or reduced-fat mayonnaise and salad dressings (reduced sodium). Avocado. Safflower, olive, or canola oils. Natural peanut or almond butter. Other Unsalted popcorn and pretzels. The items listed above may not be a complete list of recommended foods or beverages. Contact your dietitian for more options. WHAT FOODS ARE NOT RECOMMENDED? Grains White bread. White pasta. White rice. Refined cornbread. Bagels and croissants. Crackers that contain trans fat. Vegetables Creamed or fried vegetables. Vegetables in a cheese sauce. Regular canned vegetables. Regular canned tomato sauce and paste. Regular tomato and vegetable juices. Fruits Dried fruits. Canned fruit in light or heavy syrup. Fruit  juice. Meat and Other Protein Products Fatty cuts of meat. Ribs, chicken wings, bacon, sausage, bologna, salami, chitterlings, fatback, hot dogs, bratwurst, and packaged luncheon meats. Salted  nuts and seeds. Canned beans with salt. Dairy Whole or 2% milk, cream, half-and-half, and cream cheese. Whole-fat or sweetened yogurt. Full-fat cheeses or blue cheese. Nondairy creamers and whipped toppings. Processed cheese, cheese spreads, or cheese curds. Condiments Onion and garlic salt, seasoned salt, table salt, and sea salt. Canned and packaged gravies. Worcestershire sauce. Tartar sauce. Barbecue sauce. Teriyaki sauce. Soy sauce, including reduced sodium. Steak sauce. Fish sauce. Oyster sauce. Cocktail sauce. Horseradish. Ketchup and mustard. Meat flavorings and tenderizers. Bouillon cubes. Hot sauce. Tabasco sauce. Marinades. Taco seasonings. Relishes. Fats and Oils Butter, stick margarine, lard, shortening, ghee, and bacon fat. Coconut, palm kernel, or palm oils. Regular salad dressings. Other Pickles and olives. Salted popcorn and pretzels. The items listed above may not be a complete list of foods and beverages to avoid. Contact your dietitian for more information. WHERE CAN I FIND MORE INFORMATION? National Heart, Lung, and Blood Institute: travelstabloid.com Document Released: 11/20/2011 Document Revised: 04/17/2014 Document Reviewed: 10/05/2013 [facility] Patient Information 2015 [place], [place]. This information is not intended to replace advice given to you by your health care provider. Make sure you discuss any questions you have with your health care provider. Drinks 6-8 glasses of water daily. Start exercising by walking and gradually increasing your intensity and time.  I would like for you to to get 150 minutes per week of physical activity which is recommended if not more.   Preventative Care for Adults - Female      MAINTAIN REGULAR HEALTH EXAMS:  A routine yearly physical is a good way to check in with your primary care provider about your health and preventive screening. It is also an opportunity to share updates about your health and any  concerns you have, and receive a thorough all-over exam.   Most health insurance companies pay for at least some preventative services.  Check with your health plan for specific coverages.  WHAT PREVENTATIVE SERVICES DO WOMEN NEED?  Adult women should have their weight and blood pressure checked regularly.   Women age 15 and older should have their cholesterol levels checked regularly.  Women should be screened for cervical cancer with a Pap smear and pelvic exam beginning at either age 68, or 3 years after they become sexually activity.    Breast cancer screening generally begins at age 65 with a mammogram and breast exam by your primary care provider.    Beginning at age 63 and continuing to age 51, women should be screened for colorectal cancer.  Certain people may need continued testing until age 47.  Updating vaccinations is part of preventative care.  Vaccinations help protect against diseases such as the flu.  Osteoporosis is a disease in which the bones lose minerals and strength as we age. Women ages 59 and over should discuss this with their caregivers, as should women after menopause who have other risk factors.  Lab tests are generally done as part of preventative care to screen for anemia and blood disorders, to screen for problems with the kidneys and liver, to screen for bladder problems, to check blood sugar, and to check your cholesterol level.  Preventative services generally include counseling about diet, exercise, avoiding tobacco, drugs, excessive alcohol consumption, and sexually transmitted infections.    GENERAL RECOMMENDATIONS FOR GOOD HEALTH:  Healthy diet:  Eat a variety of foods, including fruit,  vegetables, animal or vegetable protein, such as meat, fish, chicken, and eggs, or beans, lentils, tofu, and grains, such as rice.  Drink plenty of water daily.  Decrease saturated fat in the diet, avoid lots of red meat, processed foods, sweets, fast foods, and  fried foods.  Exercise:  Aerobic exercise helps maintain good heart health. At least 30-40 minutes of moderate-intensity exercise is recommended. For example, a brisk walk that increases your heart rate and breathing. This should be done on most days of the week.   Find a type of exercise or a variety of exercises that you enjoy so that it becomes a part of your daily life.  Examples are running, walking, swimming, water aerobics, and biking.  For motivation and support, explore group exercise such as aerobic class, spin class, Zumba, Yoga,or  martial arts, etc.    Set exercise goals for yourself, such as a certain weight goal, walk or run in a race such as a 5k walk/run.  Speak to your primary care provider about exercise goals.  Disease prevention:  If you smoke or chew tobacco, find out from your caregiver how to quit. It can literally save your life, no matter how long you have been a tobacco user. If you do not use tobacco, never begin.   Maintain a healthy diet and normal weight. Increased weight leads to problems with blood pressure and diabetes.   The Body Mass Index or BMI is a way of measuring how much of your body is fat. Having a BMI above 27 increases the risk of heart disease, diabetes, hypertension, stroke and other problems related to obesity. Your caregiver can help determine your BMI and based on it develop an exercise and dietary program to help you achieve or maintain this important measurement at a healthful level.  High blood pressure causes heart and blood vessel problems.  Persistent high blood pressure should be treated with medicine if weight loss and exercise do not work.   Fat and cholesterol leaves deposits in your arteries that can block them. This causes heart disease and vessel disease elsewhere in your body.  If your cholesterol is found to be high, or if you have heart disease or certain other medical conditions, then you may need to have your cholesterol  monitored frequently and be treated with medication.   Ask if you should have a cardiac stress test if your history suggests this. A stress test is a test done on a treadmill that looks for heart disease. This test can find disease prior to there being a problem.  Menopause can be associated with physical symptoms and risks. Hormone replacement therapy is available to decrease these. You should talk to your caregiver about whether starting or continuing to take hormones is right for you.   Osteoporosis is a disease in which the bones lose minerals and strength as we age. This can result in serious bone fractures. Risk of osteoporosis can be identified using a bone density scan. Women ages 59 and over should discuss this with their caregivers, as should women after menopause who have other risk factors. Ask your caregiver whether you should be taking a calcium supplement and Vitamin D, to reduce the rate of osteoporosis.   Avoid drinking alcohol in excess (more than two drinks per day).  Avoid use of street drugs. Do not share needles with anyone. Ask for professional help if you need assistance or instructions on stopping the use of alcohol, cigarettes, and/or drugs.  Brush your  teeth twice a day with fluoride toothpaste, and floss once a day. Good oral hygiene prevents tooth decay and gum disease. The problems can be painful, unattractive, and can cause other health problems. Visit your dentist for a routine oral and dental check up and preventive care every 6-12 months.   Look at your skin regularly.  Use a mirror to look at your back. Notify your caregivers of changes in moles, especially if there are changes in shapes, colors, a size larger than a pencil eraser, an irregular border, or development of new moles.  Safety:  Use seatbelts 100% of the time, whether driving or as a passenger.  Use safety devices such as hearing protection if you work in environments with loud noise or significant  background noise.  Use safety glasses when doing any work that could send debris in to the eyes.  Use a helmet if you ride a bike or motorcycle.  Use appropriate safety gear for contact sports.  Talk to your caregiver about gun safety.  Use sunscreen with a SPF (or skin protection factor) of 15 or greater.  Lighter skinned people are at a greater risk of skin cancer. Don't forget to also wear sunglasses in order to protect your eyes from too much damaging sunlight. Damaging sunlight can accelerate cataract formation.   Practice safe sex. Use condoms. Condoms are used for birth control and to help reduce the spread of sexually transmitted infections (or STIs).  Some of the STIs are gonorrhea (the clap), chlamydia, syphilis, trichomonas, herpes, HPV (human papilloma virus) and HIV (human immunodeficiency virus) which causes AIDS. The herpes, HIV and HPV are viral illnesses that have no cure. These can result in disability, cancer and death.   Keep carbon monoxide and smoke detectors in your home functioning at all times. Change the batteries every 6 months or use a model that plugs into the wall.   Vaccinations:  Stay up to date with your tetanus shots and other required immunizations. You should have a booster for tetanus every 10 years. Be sure to get your flu shot every year, since 5%-20% of the [place] population comes down with the flu. The flu vaccine changes each year, so being vaccinated once is not enough. Get your shot in the fall, before the flu season peaks.   Other vaccines to consider:  Human Papilloma Virus or HPV causes cancer of the cervix, and other infections that can be transmitted from person to person. There is a vaccine for HPV, and females should get immunized between the ages of 102 and 40. It requires a series of 3 shots.   Pneumococcal vaccine to protect against certain types of pneumonia.  This is normally recommended for adults age 60 or older.  However, adults younger than 39  years old with certain underlying conditions such as diabetes, heart or lung disease should also receive the vaccine.  Shingles vaccine to protect against Varicella Zoster if you are older than age 2, or younger than 39 years old with certain underlying illness.  Hepatitis A vaccine to protect against a form of infection of the liver by a virus acquired from food.  Hepatitis B vaccine to protect against a form of infection of the liver by a virus acquired from blood or body fluids, particularly if you work in health care.  If you plan to travel internationally, check with your local health department for specific vaccination recommendations.  Cancer Screening:  Breast cancer screening is essential to preventive care for women.  All women age 72 and older should perform a breast self-exam every month. At age 18 and older, women should have their caregiver complete a breast exam each year. Women at ages 56 and older should have a mammogram (x-ray film) of the breasts. Your caregiver can discuss how often you need mammograms.    Cervical cancer screening includes taking a Pap smear (sample of cells examined under a microscope) from the cervix (end of the uterus). It also includes testing for HPV (Human Papilloma Virus, which can cause cervical cancer). Screening and a pelvic exam should begin at age 46, or 3 years after a woman becomes sexually active. Screening should occur every year, with a Pap smear but no HPV testing, up to age 27. After age 1, you should have a Pap smear every 3 years with HPV testing, if no HPV was found previously.   Most routine colon cancer screening begins at the age of 27. On a yearly basis, doctors may provide special easy to use take-home tests to check for hidden blood in the stool. Sigmoidoscopy or colonoscopy can detect the earliest forms of colon cancer and is life saving. These tests use a small camera at the end of a tube to directly examine the colon. Speak to your  caregiver about this at age 1, when routine screening begins (and is repeated every 5 years unless early forms of pre-cancerous polyps or small growths are found).

## 2015-08-15 ENCOUNTER — Telehealth: Payer: Self-pay | Admitting: Internal Medicine

## 2015-08-15 NOTE — Telephone Encounter (Signed)
Called pt to see if she could fax over lab results that she had done at [facility] if she went to get labs done

## 2015-08-15 NOTE — Addendum Note (Signed)
Addended by: Girtha Rm on: 08/15/2015 08:48 AM   Modules accepted: Orders

## 2015-08-15 NOTE — Progress Notes (Signed)
Patient states she will need to have labs drawn at [facility] based on her insurance. Will need to follow up to get these results

## 2015-11-29 ENCOUNTER — Encounter: Payer: 59 | Admitting: Women's Health

## 2016-02-12 ENCOUNTER — Ambulatory Visit (INDEPENDENT_AMBULATORY_CARE_PROVIDER_SITE_OTHER): Payer: 59 | Admitting: Physician Assistant

## 2016-02-12 VITALS — BP 148/92 | HR 92 | Temp 98.6°F | Resp 16 | Ht 64.5 in | Wt 242.8 lb

## 2016-02-12 DIAGNOSIS — R030 Elevated blood-pressure reading, without diagnosis of hypertension: Secondary | ICD-10-CM

## 2016-02-12 DIAGNOSIS — IMO0001 Reserved for inherently not codable concepts without codable children: Secondary | ICD-10-CM

## 2016-02-12 DIAGNOSIS — B9789 Other viral agents as the cause of diseases classified elsewhere: Principal | ICD-10-CM

## 2016-02-12 DIAGNOSIS — J069 Acute upper respiratory infection, unspecified: Secondary | ICD-10-CM

## 2016-02-12 MED ORDER — HYDROCOD POLST-CPM POLST ER 10-8 MG/5ML PO SUER: 5.0000 mL | Freq: Two times a day (BID) | ORAL | Status: DC | PRN

## 2016-02-12 MED ORDER — MOMETASONE FUROATE 50 MCG/ACT NA SUSP: 2.0000 | Freq: Every day | NASAL | Status: DC

## 2016-02-12 MED ORDER — GUAIFENESIN ER 1200 MG PO TB12: 1.0000 | ORAL_TABLET | Freq: Two times a day (BID) | ORAL | Status: AC

## 2016-02-12 NOTE — Patient Instructions (Signed)
Please push fluids.  Tylenol and Motrin for fever and body aches.    A humidifier can help especially when the air is dry -if you do not have a humidifier you can boil a pot of water on the stove in your home to help with the dry air.

## 2016-02-12 NOTE — Progress Notes (Signed)
   Lynn Hansen  MRN: [id number] DOB: 1976-05-24  Subjective:  Pt presents to clinic with 4 day h/o cold symptoms that seems to be getting worse.  She has nasal congestion but she has just started developing rhinorrhea today.  She has a cough and it seems dry in the am but as the day goes on it becomes more productive, it is mostly clear.  Flu vaccine - yes Sick contacts - mom  Home treatment - Rob DM - typically helps her but not this time  Patient Active Problem List   Diagnosis Date Noted  . CIN I (cervical intraepithelial neoplasia I)   . HSV (herpes simplex virus) infection   . High risk HPV infection   . GERD 02/20/2009  . HYPERGLYCEMIA, MILD 02/16/2009  . SINUSITIS, CHRONIC 05/30/2008  . ASTHMA 05/30/2008  . CIN II (cervical intraepithelial neoplasia II) 08/15/2002    No current outpatient prescriptions on file prior to visit.   No current facility-administered medications on file prior to visit.    No Known Allergies  Review of Systems  Constitutional: Positive for chills. Negative for fever.  HENT: Positive for congestion, ear pain (R>L) and rhinorrhea (this am - clear). Negative for postnasal drip.   Respiratory: Positive for cough (intermittent productive).   Gastrointestinal: Positive for vomiting (post-tussive). Negative for nausea and diarrhea.  Musculoskeletal: Positive for joint swelling. Negative for myalgias.  Allergic/Immunologic: Positive for environmental allergies (this is different).  Psychiatric/Behavioral: Positive for sleep disturbance (2nd to cough).   Objective:  BP 148/92 mmHg  Pulse 92  Temp(Src) 98.6 F (37 C) (Oral)  Resp 16  Ht 5' 4.5" (1.638 m)  Wt 242 lb 12.8 oz (110.133 kg)  BMI 41.05 kg/m2  SpO2 98%  LMP 02/12/2016  Physical Exam  Constitutional: She is oriented to person, place, and time and well-developed, well-nourished, and in no distress.  HENT:  Head: Normocephalic and atraumatic.  Right Ear: Hearing, tympanic  membrane, external ear and ear canal normal.  Left Ear: Hearing, tympanic membrane, external ear and ear canal normal.  Nose: Mucosal edema: red.  Mouth/Throat: Uvula is midline, oropharynx is clear and moist and mucous membranes are normal.  Eyes: Conjunctivae are normal.  Neck: Normal range of motion.  Cardiovascular: Normal rate, regular rhythm and normal heart sounds.   No murmur heard. Pulmonary/Chest: Effort normal and breath sounds normal.  Neurological: She is alert and oriented to person, place, and time. Gait normal.  Skin: Skin is warm and dry.  Psychiatric: Mood, memory, affect and judgment normal.  Vitals reviewed.   Assessment and Plan :  Viral URI with cough - Plan: Guaifenesin (MUCINEX MAXIMUM STRENGTH) 1200 MG TB12, mometasone (NASONEX) 50 MCG/ACT nasal spray, chlorpheniramine-HYDROcodone (TUSSIONEX PENNKINETIC ER) 10-8 MG/5ML SUER  Elevated BP - related to cold preps and not sleeping - she will keep track of this as she states that her BP has been elevated in the past - she will f/u with her PCP regarding this.  Symptomatic treatment for her cold was dicussed.  Windell Hummingbird PA-C  [facility] 02/12/2016 1:02 PM

## 2016-06-18 ENCOUNTER — Encounter: Payer: 59 | Admitting: Women's Health

## 2016-06-24 ENCOUNTER — Encounter: Payer: 59 | Admitting: Women's Health

## 2016-07-31 ENCOUNTER — Encounter: Payer: Self-pay | Admitting: Women's Health

## 2016-07-31 ENCOUNTER — Ambulatory Visit (INDEPENDENT_AMBULATORY_CARE_PROVIDER_SITE_OTHER): Payer: 59 | Admitting: Women's Health

## 2016-07-31 VITALS — BP 124/80 | Ht 64.0 in | Wt 246.0 lb

## 2016-07-31 DIAGNOSIS — A499 Bacterial infection, unspecified: Secondary | ICD-10-CM | POA: Diagnosis not present

## 2016-07-31 DIAGNOSIS — Z01419 Encounter for gynecological examination (general) (routine) without abnormal findings: Secondary | ICD-10-CM | POA: Diagnosis not present

## 2016-07-31 DIAGNOSIS — N760 Acute vaginitis: Secondary | ICD-10-CM

## 2016-07-31 DIAGNOSIS — B9689 Other specified bacterial agents as the cause of diseases classified elsewhere: Secondary | ICD-10-CM

## 2016-07-31 LAB — URINALYSIS W MICROSCOPIC + REFLEX CULTURE
Bilirubin Urine: NEGATIVE
CASTS: NONE SEEN [LPF]
Crystals: NONE SEEN [HPF]
Glucose, UA: NEGATIVE
HGB URINE DIPSTICK: NEGATIVE
Ketones, ur: NEGATIVE
Leukocytes, UA: NEGATIVE
Nitrite: NEGATIVE
PROTEIN: NEGATIVE
Specific Gravity, Urine: 1.018 (ref 1.001–1.035)
YEAST: NONE SEEN [HPF]
pH: 6.5 (ref 5.0–8.0)

## 2016-07-31 LAB — WET PREP FOR TRICH, YEAST, CLUE
TRICH WET PREP: NONE SEEN
WBC, Wet Prep HPF POC: NONE SEEN
YEAST WET PREP: NONE SEEN

## 2016-07-31 MED ORDER — METRONIDAZOLE 0.75 % VA GEL: VAGINAL | 1 refills | Status: DC

## 2016-07-31 NOTE — Patient Instructions (Signed)
Health Maintenance, Female Adopting a healthy lifestyle and getting preventive care can go a long way to promote health and wellness. Talk with your health care provider about what schedule of regular examinations is right for you. This is a good chance for you to check in with your provider about disease prevention and staying healthy. In between checkups, there are plenty of things you can do on your own. Experts have done a lot of research about which lifestyle changes and preventive measures are most likely to keep you healthy. Ask your health care provider for more information. WEIGHT AND DIET  Eat a healthy diet  Be sure to include plenty of vegetables, fruits, low-fat dairy products, and lean protein.  Do not eat a lot of foods high in solid fats, added sugars, or salt.  Get regular exercise. This is one of the most important things you can do for your health.  Most adults should exercise for at least 150 minutes each week. The exercise should increase your heart rate and make you sweat (moderate-intensity exercise).  Most adults should also do strengthening exercises at least twice a week. This is in addition to the moderate-intensity exercise.  Maintain a healthy weight  Body mass index (BMI) is a measurement that can be used to identify possible weight problems. It estimates body fat based on height and weight. Your health care provider can help determine your BMI and help you achieve or maintain a healthy weight.  For females 20 years of age and older:   A BMI below 18.5 is considered underweight.  A BMI of 18.5 to 24.9 is normal.  A BMI of 25 to 29.9 is considered overweight.  A BMI of 30 and above is considered obese.  Watch levels of cholesterol and blood lipids  You should start having your blood tested for lipids and cholesterol at 40 years of age, then have this test every 5 years.  You may need to have your cholesterol levels checked more often if:  Your lipid  or cholesterol levels are high.  You are older than 40 years of age.  You are at high risk for heart disease.  CANCER SCREENING   Lung Cancer  Lung cancer screening is recommended for adults 55-80 years old who are at high risk for lung cancer because of a history of smoking.  A yearly low-dose CT scan of the lungs is recommended for people who:  Currently smoke.  Have quit within the past 15 years.  Have at least a 30-pack-year history of smoking. A pack year is smoking an average of one pack of cigarettes a day for 1 year.  Yearly screening should continue until it has been 15 years since you quit.  Yearly screening should stop if you develop a health problem that would prevent you from having lung cancer treatment.  Breast Cancer  Practice breast self-awareness. This means understanding how your breasts normally appear and feel.  It also means doing regular breast self-exams. Let your health care provider know about any changes, no matter how small.  If you are in your 20s or 30s, you should have a clinical breast exam (CBE) by a health care provider every 1-3 years as part of a regular health exam.  If you are 40 or older, have a CBE every year. Also consider having a breast X-ray (mammogram) every year.  If you have a family history of breast cancer, talk to your health care provider about genetic screening.  If you   are at high risk for breast cancer, talk to your health care provider about having an MRI and a mammogram every year.  Breast cancer gene (BRCA) assessment is recommended for women who have family members with BRCA-related cancers. BRCA-related cancers include:  Breast.  Ovarian.  Tubal.  Peritoneal cancers.  Results of the assessment will determine the need for genetic counseling and BRCA1 and BRCA2 testing. Cervical Cancer Your health care provider may recommend that you be screened regularly for cancer of the pelvic organs (ovaries, uterus, and  vagina). This screening involves a pelvic examination, including checking for microscopic changes to the surface of your cervix (Pap test). You may be encouraged to have this screening done every 3 years, beginning at age 21.  For women ages 30-65, health care providers may recommend pelvic exams and Pap testing every 3 years, or they may recommend the Pap and pelvic exam, combined with testing for human papilloma virus (HPV), every 5 years. Some types of HPV increase your risk of cervical cancer. Testing for HPV may also be done on women of any age with unclear Pap test results.  Other health care providers may not recommend any screening for nonpregnant women who are considered low risk for pelvic cancer and who do not have symptoms. Ask your health care provider if a screening pelvic exam is right for you.  If you have had past treatment for cervical cancer or a condition that could lead to cancer, you need Pap tests and screening for cancer for at least 20 years after your treatment. If Pap tests have been discontinued, your risk factors (such as having a new sexual partner) need to be reassessed to determine if screening should resume. Some women have medical problems that increase the chance of getting cervical cancer. In these cases, your health care provider may recommend more frequent screening and Pap tests. Colorectal Cancer  This type of cancer can be detected and often prevented.  Routine colorectal cancer screening usually begins at 40 years of age and continues through 40 years of age.  Your health care provider may recommend screening at an earlier age if you have risk factors for colon cancer.  Your health care provider may also recommend using home test kits to check for hidden blood in the stool.  A small camera at the end of a tube can be used to examine your colon directly (sigmoidoscopy or colonoscopy). This is done to check for the earliest forms of colorectal  cancer.  Routine screening usually begins at age 50.  Direct examination of the colon should be repeated every 5-10 years through 40 years of age. However, you may need to be screened more often if early forms of precancerous polyps or small growths are found. Skin Cancer  Check your skin from head to toe regularly.  Tell your health care provider about any new moles or changes in moles, especially if there is a change in a mole's shape or color.  Also tell your health care provider if you have a mole that is larger than the size of a pencil eraser.  Always use sunscreen. Apply sunscreen liberally and repeatedly throughout the day.  Protect yourself by wearing long sleeves, pants, a wide-brimmed hat, and sunglasses whenever you are outside. HEART DISEASE, DIABETES, AND HIGH BLOOD PRESSURE   High blood pressure causes heart disease and increases the risk of stroke. High blood pressure is more likely to develop in:  People who have blood pressure in the high end   of the normal range (130-139/85-89 mm Hg).  People who are overweight or obese.  People who are African American.  If you are 38-23 years of age, have your blood pressure checked every 3-5 years. If you are 61 years of age or older, have your blood pressure checked every year. You should have your blood pressure measured twice--once when you are at a hospital or clinic, and once when you are not at a hospital or clinic. Record the average of the two measurements. To check your blood pressure when you are not at a hospital or clinic, you can use:  An automated blood pressure machine at a pharmacy.  A home blood pressure monitor.  If you are between 45 years and 39 years old, ask your health care provider if you should take aspirin to prevent strokes.  Have regular diabetes screenings. This involves taking a blood sample to check your fasting blood sugar level.  If you are at a normal weight and have a low risk for diabetes,  have this test once every three years after 40 years of age.  If you are overweight and have a high risk for diabetes, consider being tested at a younger age or more often. PREVENTING INFECTION  Hepatitis B  If you have a higher risk for hepatitis B, you should be screened for this virus. You are considered at high risk for hepatitis B if:  You were born in a country where hepatitis B is common. Ask your health care provider which countries are considered high risk.  Your parents were born in a high-risk country, and you have not been immunized against hepatitis B (hepatitis B vaccine).  You have HIV or AIDS.  You use needles to inject street drugs.  You live with someone who has hepatitis B.  You have had sex with someone who has hepatitis B.  You get hemodialysis treatment.  You take certain medicines for conditions, including cancer, organ transplantation, and autoimmune conditions. Hepatitis C  Blood testing is recommended for:  Everyone born from [birth year] through 1965.  Anyone with known risk factors for hepatitis C. Sexually transmitted infections (STIs)  You should be screened for sexually transmitted infections (STIs) including gonorrhea and chlamydia if:  You are sexually active and are younger than 40 years of age.  You are older than 40 years of age and your health care provider tells you that you are at risk for this type of infection.  Your sexual activity has changed since you were last screened and you are at an increased risk for chlamydia or gonorrhea. Ask your health care provider if you are at risk.  If you do not have HIV, but are at risk, it may be recommended that you take a prescription medicine daily to prevent HIV infection. This is called pre-exposure prophylaxis (PrEP). You are considered at risk if:  You are sexually active and do not regularly use condoms or know the HIV status of your partner(s).  You take drugs by injection.  You are sexually  active with a partner who has HIV. Talk with your health care provider about whether you are at high risk of being infected with HIV. If you choose to begin PrEP, you should first be tested for HIV. You should then be tested every 3 months for as long as you are taking PrEP.  PREGNANCY   If you are premenopausal and you may become pregnant, ask your health care provider about preconception counseling.  If you may  become pregnant, take 400 to 800 micrograms (mcg) of folic acid every day.  If you want to prevent pregnancy, talk to your health care provider about birth control (contraception). OSTEOPOROSIS AND MENOPAUSE   Osteoporosis is a disease in which the bones lose minerals and strength with aging. This can result in serious bone fractures. Your risk for osteoporosis can be identified using a bone density scan.  If you are 61 years of age or older, or if you are at risk for osteoporosis and fractures, ask your health care provider if you should be screened.  Ask your health care provider whether you should take a calcium or vitamin D supplement to lower your risk for osteoporosis.  Menopause may have certain physical symptoms and risks.  Hormone replacement therapy may reduce some of these symptoms and risks. Talk to your health care provider about whether hormone replacement therapy is right for you.  HOME CARE INSTRUCTIONS   Schedule regular health, dental, and eye exams.  Stay current with your immunizations.   Do not use any tobacco products including cigarettes, chewing tobacco, or electronic cigarettes.  If you are pregnant, do not drink alcohol.  If you are breastfeeding, limit how much and how often you drink alcohol.  Limit alcohol intake to no more than 1 drink per day for nonpregnant women. One drink equals 12 ounces of beer, 5 ounces of wine, or 1 ounces of hard liquor.  Do not use street drugs.  Do not share needles.  Ask your health care provider for help if  you need support or information about quitting drugs.  Tell your health care provider if you often feel depressed.  Tell your health care provider if you have ever been abused or do not feel safe at home.   This information is not intended to replace advice given to you by your health care provider. Make sure you discuss any questions you have with your health care provider.   Document Released: 06/16/2011 Document Revised: 12/22/2014 Document Reviewed: 11/02/2013 Elsevier Interactive Patient Education [employer].

## 2016-07-31 NOTE — Progress Notes (Signed)
Lynn Hansen 17-Dec-1975 [id number]    History:    Presents for annual exam.  Please cycles/condoms/same partner. 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  Past medical history, past surgical history, family history and social history were all reviewed and documented in the EPIC chart. Adopted. Lynn Hansen 16 doing well. Works for [employer].  ROS:  A ROS was performed and pertinent positives and negatives are included.  Exam:  Vitals:   07/31/16 0811  BP: 124/80  Weight: 246 lb (111.6 kg)  Height: 5\' 4"  (1.626 m)   Body mass index is 42.23 kg/m.   General appearance:  Normal Thyroid:  Symmetrical, normal in size, without palpable masses or nodularity. Respiratory  Auscultation:  Clear without wheezing or rhonchi Cardiovascular  Auscultation:  Regular rate, without rubs, murmurs or gallops  Edema/varicosities:  Not grossly evident Abdominal  Soft,nontender, without masses, guarding or rebound.  Liver/spleen:  No organomegaly noted  Hernia:  None appreciated  Skin  Inspection:  Grossly normal   Breasts: Examined lying and sitting.     Right: Without masses, retractions, discharge or axillary adenopathy.     Left: Without masses, retractions, discharge or axillary adenopathy. Gentitourinary   Inguinal/mons:  Normal without inguinal adenopathy  External genitalia:  Normal  BUS/Urethra/Skene's glands:  Normal  Vagina: Discharge with odor, wet prep positive for moderate clues, TNTC bacteria  Cervix:  Normal  Uterus:   normal in size, shape and contour.  Midline and mobile  Adnexa/parametria:     Rt: Without masses or tenderness.   Lt: Without masses or tenderness.  Anus and perineum: Normal  Digital rectal exam: Normal sphincter tone without palpated masses or tenderness  Assessment/Plan:  40 y.o. SBF G 1 P1  for annual exam with no complaints.  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 Reports discharge with odor after cycle will try applicator of MetroGel after cycles will call if no relief of symptoms. SBE's, annual screening 3-D mammogram encouraged, unknown family history. Reviewed importance of increasing exercise and decreasing calories for weight loss. Vitamin D 1000 daily encouraged. Contraception options reviewed and declined will continue condoms. Pap with HR HPV typing. GC/Chlamydia with Pap declines need for STD screen.  [facility]uel [facility], 9:16 AM 07/31/2016

## 2016-08-01 LAB — PAP IG, CT-NG NAA, HPV HIGH-RISK
CHLAMYDIA PROBE AMP: NOT DETECTED
GC Probe Amp: NOT DETECTED
HPV DNA High Risk: DETECTED — AB

## 2016-08-02 LAB — URINE CULTURE

## 2016-08-06 ENCOUNTER — Other Ambulatory Visit: Payer: Self-pay | Admitting: Women's Health

## 2016-08-06 DIAGNOSIS — Z1231 Encounter for screening mammogram for malignant neoplasm of breast: Secondary | ICD-10-CM

## 2016-08-15 ENCOUNTER — Ambulatory Visit
Admission: RE | Admit: 2016-08-15 | Discharge: 2016-08-15 | Disposition: A | Discharge status: Home / Self Care | Payer: 59 | Source: Ambulatory Visit | Attending: Women's Health | Admitting: Women's Health

## 2016-08-15 DIAGNOSIS — Z1231 Encounter for screening mammogram for malignant neoplasm of breast: Secondary | ICD-10-CM

## 2016-08-19 ENCOUNTER — Ambulatory Visit (INDEPENDENT_AMBULATORY_CARE_PROVIDER_SITE_OTHER): Payer: 59 | Admitting: Gynecology

## 2016-08-19 VITALS — BP 120/76

## 2016-08-19 DIAGNOSIS — R87810 Cervical high risk human papillomavirus (HPV) DNA test positive: Secondary | ICD-10-CM

## 2016-08-19 NOTE — Patient Instructions (Signed)
Office will call you with biopsy results 

## 2016-08-19 NOTE — Addendum Note (Signed)
Addended by: Anastasio Auerbach on: 08/19/2016 04:03 PM   Modules accepted: Orders

## 2016-08-19 NOTE — Progress Notes (Signed)
    Lynn Hansen [birth year] [id number]        40 y.o.  [id number] presents for colposcopy. Patient has history of LEEP 2003 for CIN-2 with positive endocervical margins. 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 Most recent Pap smear 07/2016 showed normal cytology but positive high-risk HPV screen.  Past medical history,surgical history, problem list, medications, allergies, family history and social history were all reviewed and documented in the EPIC chart.  Directed ROS with pertinent positives and negatives documented in the history of present illness/assessment and plan.  Exam: Caryn Bee assistant Vitals:   08/19/16 1507  BP: 120/76   General appearance:  Normal Abdomen obese without masses guarding rebound Pelvic external BUS vagina normal. Cervix grossly normal. Bimanual without gross masses or tenderness. Uterus difficult to palpate. Adnexa without gross masses or tenderness  Colposcopy performed after acetic acid cleanse is inadequate but no abnormalities seen. Cervix was gently dilated with a disposable dilator and ECC was performed. Patient tolerated well.  Physical Exam  Genitourinary:       Assessment/Plan:  40 y.o. [id number] with history as above. ECC was performed. Patient will follow up for results. If negative them plan follow up Pap smear one year. Otherwise them will triage based on results. I discussed with her the whole issue of dysplasia, low-grade/high-grade and the positive HPV association.   Anastasio Auerbach, M.D.

## 2016-08-20 ENCOUNTER — Telehealth: Payer: Self-pay | Admitting: *Deleted

## 2016-08-20 LAB — PATHOLOGY

## 2016-08-20 NOTE — Telephone Encounter (Signed)
Pt called requesting mammogram results from 08/15/16, I called and left on her voicemail, normal results.

## 2016-08-21 NOTE — Progress Notes (Signed)
Patient informed. 

## 2016-11-11 ENCOUNTER — Ambulatory Visit (INDEPENDENT_AMBULATORY_CARE_PROVIDER_SITE_OTHER): Payer: 59 | Admitting: Women's Health

## 2016-11-11 ENCOUNTER — Encounter: Payer: Self-pay | Admitting: Women's Health

## 2016-11-11 VITALS — BP 128/80 | Ht 64.0 in | Wt 246.0 lb

## 2016-11-11 DIAGNOSIS — N760 Acute vaginitis: Secondary | ICD-10-CM | POA: Diagnosis not present

## 2016-11-11 DIAGNOSIS — B9689 Other specified bacterial agents as the cause of diseases classified elsewhere: Secondary | ICD-10-CM

## 2016-11-11 LAB — WET PREP FOR TRICH, YEAST, CLUE
TRICH WET PREP: NONE SEEN
WBC WET PREP: NONE SEEN
YEAST WET PREP: NONE SEEN

## 2016-11-11 MED ORDER — METRONIDAZOLE 500 MG PO TABS: 500.0000 mg | ORAL_TABLET | Freq: Two times a day (BID) | ORAL | 0 refills | Status: DC

## 2016-11-11 NOTE — Patient Instructions (Signed)
Bacterial Vaginosis Bacterial vaginosis is an infection of the vagina. It happens when too many germs (bacteria) grow in the vagina. This infection puts you at risk for infections from sex (STIs). Treating this infection can lower your risk for some STIs. You should also treat this if you are pregnant. It can cause your baby to be born early. Follow these instructions at home: Medicines  Take over-the-counter and prescription medicines only as told by your doctor.  Take or use your antibiotic medicine as told by your doctor. Do not stop taking or using it even if you start to feel better. General instructions  If you your sexual partner is a woman, tell her that you have this infection. She needs to get treatment if she has symptoms. If you have a female partner, he does not need to be treated.  During treatment:  Avoid sex.  Do not douche.  Avoid alcohol as told.  Avoid breastfeeding as told.  Drink enough fluid to keep your pee (urine) clear or pale yellow.  Keep your vagina and butt (rectum) clean.  Wash the area with warm water every day.  Wipe from front to back after you use the toilet.  Keep all follow-up visits as told by your doctor. This is important. Preventing this condition  Do not douche.  Use only warm water to wash around your vagina.  Use protection when you have sex. This includes:  Latex condoms.  Dental dams.  Limit how many people you have sex with. It is best to only have sex with the same person (be monogamous).  Get tested for STIs. Have your partner get tested.  Wear underwear that is cotton or lined with cotton.  Avoid tight pants and pantyhose. This is most important in summer.  Do not use any products that have nicotine or tobacco in them. These include cigarettes and e-cigarettes. If you need help quitting, ask your doctor.  Do not use illegal drugs.  Limit how much alcohol you drink. Contact a doctor if:  Your symptoms do not get  better, even after you are treated.  You have more discharge or pain when you pee (urinate).  You have a fever.  You have pain in your belly (abdomen).  You have pain with sex.  Your bleed from your vagina between periods. Summary  This infection happens when too many germs (bacteria) grow in the vagina.  Treating this condition can lower your risk for some infections from sex (STIs).  You should also treat this if you are pregnant. It can cause early (premature) birth.  Do not stop taking or using your antibiotic medicine even if you start to feel better. This information is not intended to replace advice given to you by your health care provider. Make sure you discuss any questions you have with your health care provider. Document Released: 09/09/2008 Document Revised: 08/16/2016 Document Reviewed: 08/16/2016 Elsevier Interactive Patient Education  2017 [employer] DASH stands for "Dietary Approaches to Stop Hypertension." The DASH eating plan is a healthy eating plan that has been shown to reduce high blood pressure (hypertension). Additional health benefits may include reducing the risk of type 2 diabetes mellitus, heart disease, and stroke. The DASH eating plan may also help with weight loss. What do I need to know about the DASH eating plan? For the DASH eating plan, you will follow these general guidelines:  Choose foods with less than 150 milligrams of sodium per serving (as listed on the  food label).  Use salt-free seasonings or herbs instead of table salt or sea salt.  Check with your health care provider or pharmacist before using salt substitutes.  Eat lower-sodium products. These are often labeled as "low-sodium" or "no salt added."  Eat fresh foods. Avoid eating a lot of canned foods.  Eat more vegetables, fruits, and low-fat dairy products.  Choose whole grains. Look for the word "whole" as the first word in the ingredient list.  Choose  fish and skinless chicken or [place] more often than red meat. Limit fish, poultry, and meat to 6 oz (170 g) each day.  Limit sweets, desserts, sugars, and sugary drinks.  Choose heart-healthy fats.  Eat more home-cooked food and less restaurant, buffet, and fast food.  Limit fried foods.  Do not fry foods. Cook foods using methods such as baking, boiling, grilling, and broiling instead.  When eating at a restaurant, ask that your food be prepared with less salt, or no salt if possible. What foods can I eat? Seek help from a dietitian for individual calorie needs. Grains  Whole grain or whole wheat bread. Brown rice. Whole grain or whole wheat pasta. Quinoa, bulgur, and whole grain cereals. Low-sodium cereals. Corn or whole wheat flour tortillas. Whole grain cornbread. Whole grain crackers. Low-sodium crackers. Vegetables  Fresh or frozen vegetables (raw, steamed, roasted, or grilled). Low-sodium or reduced-sodium tomato and vegetable juices. Low-sodium or reduced-sodium tomato sauce and paste. Low-sodium or reduced-sodium canned vegetables. Fruits  All fresh, canned (in natural juice), or frozen fruits. Meat and Other Protein Products  Ground beef (85% or leaner), grass-fed beef, or beef trimmed of fat. Skinless chicken or [place]. Ground chicken or [place]. Pork trimmed of fat. All fish and seafood. Eggs. Dried beans, peas, or lentils. Unsalted nuts and seeds. Unsalted canned beans. Dairy  Low-fat dairy products, such as skim or 1% milk, 2% or reduced-fat cheeses, low-fat ricotta or cottage cheese, or plain low-fat yogurt. Low-sodium or reduced-sodium cheeses. Fats and Oils  Tub margarines without trans fats. Light or reduced-fat mayonnaise and salad dressings (reduced sodium). Avocado. Safflower, olive, or canola oils. Natural peanut or almond butter. Other  Unsalted popcorn and pretzels. The items listed above may not be a complete list of recommended foods or beverages. Contact your  dietitian for more options.  What foods are not recommended? Grains  White bread. White pasta. White rice. Refined cornbread. Bagels and croissants. Crackers that contain trans fat. Vegetables  Creamed or fried vegetables. Vegetables in a cheese sauce. Regular canned vegetables. Regular canned tomato sauce and paste. Regular tomato and vegetable juices. Fruits  Canned fruit in light or heavy syrup. Fruit juice. Meat and Other Protein Products  Fatty cuts of meat. Ribs, chicken wings, bacon, sausage, bologna, salami, chitterlings, fatback, hot dogs, bratwurst, and packaged luncheon meats. Salted nuts and seeds. Canned beans with salt. Dairy  Whole or 2% milk, cream, half-and-half, and cream cheese. Whole-fat or sweetened yogurt. Full-fat cheeses or blue cheese. Nondairy creamers and whipped toppings. Processed cheese, cheese spreads, or cheese curds. Condiments  Onion and garlic salt, seasoned salt, table salt, and sea salt. Canned and packaged gravies. Worcestershire sauce. Tartar sauce. Barbecue sauce. Teriyaki sauce. Soy sauce, including reduced sodium. Steak sauce. Fish sauce. Oyster sauce. Cocktail sauce. Horseradish. Ketchup and mustard. Meat flavorings and tenderizers. Bouillon cubes. Hot sauce. Tabasco sauce. Marinades. Taco seasonings. Relishes. Fats and Oils  Butter, stick margarine, lard, shortening, ghee, and bacon fat. Coconut, palm kernel, or palm oils. Regular salad dressings. Other  Pickles and olives. Salted popcorn and pretzels. The items listed above may not be a complete list of foods and beverages to avoid. Contact your dietitian for more information.  Where can I find more information? National Heart, Lung, and Blood Institute: travelstabloid.com This information is not intended to replace advice given to you by your health care provider. Make sure you discuss any questions you have with your health care provider. Document Released:  11/20/2011 Document Revised: 05/08/2016 Document Reviewed: 10/05/2013 Elsevier Interactive Patient Education  2017 [employer].

## 2016-11-11 NOTE — Progress Notes (Signed)
Presents for evaluation of excessive malodorous discharge. Was diagnosed with BV in August and was prescribed metrogel. States that the copay was too expensive, took old prescription of flagyl.  Same partner, using condoms. Denies urinary symptoms, fever, chills, abdominal pain or back ache. Prescribed Prinzide for hypertension, states that she does not take it, has changed diet and is trying to exercise regularly.  Exam: Appears well. External genitalia: normal. Speculum exam: White malodorous discharge. Bimanual: No adenexal tenderness of CMT. Wet prep: Many clue cells, TNTC bacteria BP 128/80  Bacterial Vaginosis Obesity Borderline hypertension  Plan: Flagyl 500 mg tablet BID for 7 days, alcohol precautions reviewed. DASH diet advised for blood pressure control. Exercise and calorie reduction encouraged. Instructed to call if symptoms do not improve. Other contraception options reviewed, declines, Plan B emergency contraception reviewed.

## 2016-12-31 ENCOUNTER — Encounter: Payer: 59 | Admitting: Family Medicine

## 2017-01-08 ENCOUNTER — Encounter: Payer: Self-pay | Admitting: Family Medicine

## 2017-01-08 ENCOUNTER — Ambulatory Visit (INDEPENDENT_AMBULATORY_CARE_PROVIDER_SITE_OTHER): Payer: 59 | Admitting: Family Medicine

## 2017-01-08 VITALS — BP 160/98 | HR 62 | Temp 98.3°F | Wt 248.0 lb

## 2017-01-08 DIAGNOSIS — H6123 Impacted cerumen, bilateral: Secondary | ICD-10-CM

## 2017-01-08 DIAGNOSIS — J302 Other seasonal allergic rhinitis: Secondary | ICD-10-CM | POA: Diagnosis not present

## 2017-01-08 DIAGNOSIS — R053 Chronic cough: Secondary | ICD-10-CM

## 2017-01-08 DIAGNOSIS — R202 Paresthesia of skin: Secondary | ICD-10-CM

## 2017-01-08 DIAGNOSIS — I10 Essential (primary) hypertension: Secondary | ICD-10-CM | POA: Diagnosis not present

## 2017-01-08 DIAGNOSIS — Z87898 Personal history of other specified conditions: Secondary | ICD-10-CM

## 2017-01-08 DIAGNOSIS — R05 Cough: Secondary | ICD-10-CM

## 2017-01-08 LAB — CBC WITH DIFFERENTIAL/PLATELET
BASOS ABS: 0 {cells}/uL (ref 0–200)
Basophils Relative: 0 %
EOS ABS: 61 {cells}/uL (ref 15–500)
Eosinophils Relative: 1 %
HEMATOCRIT: 36.9 % (ref 35.0–45.0)
HEMOGLOBIN: 12 g/dL (ref 11.7–15.5)
LYMPHS ABS: 2623 {cells}/uL (ref 850–3900)
LYMPHS PCT: 43 %
MCH: 26.4 pg — AB (ref 27.0–33.0)
MCHC: 32.5 g/dL (ref 32.0–36.0)
MCV: 81.3 fL (ref 80.0–100.0)
MONO ABS: 366 {cells}/uL (ref 200–950)
MPV: 9.7 fL (ref 7.5–12.5)
Monocytes Relative: 6 %
NEUTROS PCT: 50 %
Neutro Abs: 3050 cells/uL (ref 1500–7800)
Platelets: 462 10*3/uL — ABNORMAL HIGH (ref 140–400)
RBC: 4.54 MIL/uL (ref 3.80–5.10)
RDW: 14.3 % (ref 11.0–15.0)
WBC: 6.1 10*3/uL (ref 4.0–10.5)

## 2017-01-08 LAB — COMPREHENSIVE METABOLIC PANEL
ALBUMIN: 4.3 g/dL (ref 3.6–5.1)
ALT: 14 U/L (ref 6–29)
AST: 14 U/L (ref 10–30)
Alkaline Phosphatase: 88 U/L (ref 33–115)
BILIRUBIN TOTAL: 0.7 mg/dL (ref 0.2–1.2)
BUN: 8 mg/dL (ref 7–25)
CALCIUM: 9.3 mg/dL (ref 8.6–10.2)
CHLORIDE: 103 mmol/L (ref 98–110)
CO2: 25 mmol/L (ref 20–31)
Creat: 0.52 mg/dL (ref 0.50–1.10)
Glucose, Bld: 88 mg/dL (ref 65–99)
POTASSIUM: 3.8 mmol/L (ref 3.5–5.3)
Sodium: 139 mmol/L (ref 135–146)
Total Protein: 7.5 g/dL (ref 6.1–8.1)

## 2017-01-08 LAB — POCT URINALYSIS DIPSTICK
BILIRUBIN UA: NEGATIVE
Glucose, UA: NEGATIVE
KETONES UA: NEGATIVE
LEUKOCYTES UA: NEGATIVE
Nitrite, UA: NEGATIVE
PH UA: 6
Protein, UA: NEGATIVE
Urobilinogen, UA: NEGATIVE

## 2017-01-08 LAB — TSH: TSH: 1.32 mIU/L

## 2017-01-08 NOTE — Progress Notes (Signed)
Subjective:    Patient ID: Lynn Hansen, female    DOB: [birth year], 41 y.o.   MRN: [id number]  HPI Chief Complaint  Patient presents with  . dry cough    dry cough since october. thought it was her bp med so she stopped and still coughing, not sure if its allergies.   [name] tingling    tingling in the right foot- couple months   She is here with complaints of a dry cough and post nasal drainage for at least 3 months. States it seems like it is getting better but is not completely gone.  States she has a history of seasonal allergies. States she took Zyrtec in the past but nothing in the past several months. Denies rhinorrhea, nasal congestion, ear pain, sore throat, shortness of breath, wheezing.   States she stopped BP medication, did not get it refilled. Requests refill today. Does not check BP at home.   Reports history of prediabetes.   Also complains of tingling to the 2nd and 3rd toes on her right foot for the past 4-5 months. States tingling is ongoing and unchanged. Does not radiate.  Denies numbness, edema, pain, weakness.   Denies fever, chills, fatigue, chest pain, palpitations, abdominal pain, GI or GU symptoms.    Review of Systems Pertinent positives and negatives in the history of present illness.     Objective:   Physical Exam  Constitutional: She is oriented to person, place, and time. She appears well-developed and well-nourished. No distress.  HENT:  Right Ear: Hearing normal.  Left Ear: Hearing normal.  Nose: Mucosal edema present. Right sinus exhibits no maxillary sinus tenderness and no frontal sinus tenderness. Left sinus exhibits no maxillary sinus tenderness and no frontal sinus tenderness.  Mouth/Throat: Uvula is midline and mucous membranes are normal. Posterior oropharyngeal erythema present.  Cerumen impaction bilaterally   Eyes: Conjunctivae and EOM are normal. Pupils are equal, round, and reactive to light.  Cardiovascular: Normal rate, regular  rhythm, normal heart sounds and normal pulses.  PMI is not displaced.  Exam reveals no gallop.   No murmur heard. Pulmonary/Chest: Effort normal and breath sounds normal.  Musculoskeletal:       Right foot: Normal.  Lymphadenopathy:    She has no cervical adenopathy.  Neurological: She is alert and oriented to person, place, and time. She has normal strength and normal reflexes. No cranial nerve deficit or sensory deficit. Gait normal.  Skin: Skin is warm and dry. No rash noted. No pallor.   BP (!) 160/98   Pulse 62   Temp 98.3 F (36.8 C) (Oral)   Wt 248 lb (112.5 kg)   SpO2 99%   BMI 42.57 kg/m   UA dipstick: 2+ blood     Assessment & Plan:  Persistent dry cough  Chronic seasonal allergic rhinitis, unspecified trigger  Hypertension, unspecified type - Plan: CBC with Differential/Platelet, Comprehensive metabolic panel, POCT urinalysis dipstick  Paresthesia of right foot - Plan: CBC with Differential/Platelet, Comprehensive metabolic panel, TSH, Vitamin B12, RPR, Sedimentation rate  History of prediabetes - Plan: CBC with Differential/Platelet, Comprehensive metabolic panel, TSH, Hemoglobin A1c, POCT urinalysis dipstick  Bilateral impacted cerumen  Start blood pressure medication. Check blood pressure at home or at a local pharmacy and let me know what readings have been. Watch your sodium intake.  Start taking an antihistamine (Zyrtec or Allegra or Claritin or Xyzal) and Use Flonase.  Plan to check labs to rule out underlying etiology for paresthesias of right  foot.  Discussed controlling blood sugars and preventing prediabetes from advancing.  She will return for cerumen disimpaction if she wishes.  Follow up in 1 month or sooner pending labs. Will repeat urine at that time due to UA dipstick pos for blood today.

## 2017-01-08 NOTE — Patient Instructions (Addendum)
Start blood pressure medication. Check your blood pressure at home or at a local pharmacy and let me know what your readings have been at your next visit. Watch your sodium intake.  Start taking an antihistamine (Zyrtec or Allegra or Claritin or Xyzal) Use Flonase.  We will call you with lab results.  Follow up in 1 month.   DASH Eating Plan DASH stands for "Dietary Approaches to Stop Hypertension." The DASH eating plan is a healthy eating plan that has been shown to reduce high blood pressure (hypertension). Additional health benefits may include reducing the risk of type 2 diabetes mellitus, heart disease, and stroke. The DASH eating plan may also help with weight loss. What do I need to know about the DASH eating plan? For the DASH eating plan, you will follow these general guidelines:  Choose foods with less than 150 milligrams of sodium per serving (as listed on the food label).  Use salt-free seasonings or herbs instead of table salt or sea salt.  Check with your health care provider or pharmacist before using salt substitutes.  Eat lower-sodium products. These are often labeled as "low-sodium" or "no salt added."  Eat fresh foods. Avoid eating a lot of canned foods.  Eat more vegetables, fruits, and low-fat dairy products.  Choose whole grains. Look for the word "whole" as the first word in the ingredient list.  Choose fish and skinless chicken or [place] more often than red meat. Limit fish, poultry, and meat to 6 oz (170 g) each day.  Limit sweets, desserts, sugars, and sugary drinks.  Choose heart-healthy fats.  Eat more home-cooked food and less restaurant, buffet, and fast food.  Limit fried foods.  Do not fry foods. Cook foods using methods such as baking, boiling, grilling, and broiling instead.  When eating at a restaurant, ask that your food be prepared with less salt, or no salt if possible. What foods can I eat? Seek help from a dietitian for individual calorie  needs. Grains  Whole grain or whole wheat bread. Brown rice. Whole grain or whole wheat pasta. Quinoa, bulgur, and whole grain cereals. Low-sodium cereals. Corn or whole wheat flour tortillas. Whole grain cornbread. Whole grain crackers. Low-sodium crackers. Vegetables  Fresh or frozen vegetables (raw, steamed, roasted, or grilled). Low-sodium or reduced-sodium tomato and vegetable juices. Low-sodium or reduced-sodium tomato sauce and paste. Low-sodium or reduced-sodium canned vegetables. Fruits  All fresh, canned (in natural juice), or frozen fruits. Meat and Other Protein Products  Ground beef (85% or leaner), grass-fed beef, or beef trimmed of fat. Skinless chicken or [place]. Ground chicken or [place]. Pork trimmed of fat. All fish and seafood. Eggs. Dried beans, peas, or lentils. Unsalted nuts and seeds. Unsalted canned beans. Dairy  Low-fat dairy products, such as skim or 1% milk, 2% or reduced-fat cheeses, low-fat ricotta or cottage cheese, or plain low-fat yogurt. Low-sodium or reduced-sodium cheeses. Fats and Oils  Tub margarines without trans fats. Light or reduced-fat mayonnaise and salad dressings (reduced sodium). Avocado. Safflower, olive, or canola oils. Natural peanut or almond butter. Other  Unsalted popcorn and pretzels. The items listed above may not be a complete list of recommended foods or beverages. Contact your dietitian for more options.  What foods are not recommended? Grains  White bread. White pasta. White rice. Refined cornbread. Bagels and croissants. Crackers that contain trans fat. Vegetables  Creamed or fried vegetables. Vegetables in a cheese sauce. Regular canned vegetables. Regular canned tomato sauce and paste. Regular tomato and vegetable juices.  Fruits  Canned fruit in light or heavy syrup. Fruit juice. Meat and Other Protein Products  Fatty cuts of meat. Ribs, chicken wings, bacon, sausage, bologna, salami, chitterlings, fatback, hot dogs, bratwurst, and  packaged luncheon meats. Salted nuts and seeds. Canned beans with salt. Dairy  Whole or 2% milk, cream, half-and-half, and cream cheese. Whole-fat or sweetened yogurt. Full-fat cheeses or blue cheese. Nondairy creamers and whipped toppings. Processed cheese, cheese spreads, or cheese curds. Condiments  Onion and garlic salt, seasoned salt, table salt, and sea salt. Canned and packaged gravies. Worcestershire sauce. Tartar sauce. Barbecue sauce. Teriyaki sauce. Soy sauce, including reduced sodium. Steak sauce. Fish sauce. Oyster sauce. Cocktail sauce. Horseradish. Ketchup and mustard. Meat flavorings and tenderizers. Bouillon cubes. Hot sauce. Tabasco sauce. Marinades. Taco seasonings. Relishes. Fats and Oils  Butter, stick margarine, lard, shortening, ghee, and bacon fat. Coconut, palm kernel, or palm oils. Regular salad dressings. Other  Pickles and olives. Salted popcorn and pretzels. The items listed above may not be a complete list of foods and beverages to avoid. Contact your dietitian for more information.  Where can I find more information? National Heart, Lung, and Blood Institute: travelstabloid.com This information is not intended to replace advice given to you by your health care provider. Make sure you discuss any questions you have with your health care provider. Document Released: 11/20/2011 Document Revised: 05/08/2016 Document Reviewed: 10/05/2013 Elsevier Interactive Patient Education  2017 [employer].

## 2017-01-09 LAB — HEMOGLOBIN A1C
HEMOGLOBIN A1C: 5.8 % — AB (ref <5.7)
MEAN PLASMA GLUCOSE: 120 mg/dL

## 2017-01-09 LAB — RPR

## 2017-01-09 LAB — SEDIMENTATION RATE: Sed Rate: 21 mm/hr — ABNORMAL HIGH (ref 0–20)

## 2017-01-09 LAB — VITAMIN B12: VITAMIN B 12: 381 pg/mL (ref 200–1100)

## 2017-01-20 ENCOUNTER — Ambulatory Visit
Admission: RE | Admit: 2017-01-20 | Discharge: 2017-01-20 | Disposition: A | Discharge status: Home / Self Care | Payer: 59 | Source: Ambulatory Visit | Attending: Family Medicine | Admitting: Family Medicine

## 2017-01-20 ENCOUNTER — Ambulatory Visit (INDEPENDENT_AMBULATORY_CARE_PROVIDER_SITE_OTHER): Payer: 59 | Admitting: Family Medicine

## 2017-01-20 ENCOUNTER — Encounter: Payer: Self-pay | Admitting: Family Medicine

## 2017-01-20 VITALS — BP 124/82 | HR 84 | Temp 98.2°F | Resp 16 | Wt 249.0 lb

## 2017-01-20 DIAGNOSIS — R942 Abnormal results of pulmonary function studies: Secondary | ICD-10-CM

## 2017-01-20 DIAGNOSIS — Z8709 Personal history of other diseases of the respiratory system: Secondary | ICD-10-CM | POA: Diagnosis not present

## 2017-01-20 DIAGNOSIS — H6123 Impacted cerumen, bilateral: Secondary | ICD-10-CM | POA: Diagnosis not present

## 2017-01-20 DIAGNOSIS — R05 Cough: Secondary | ICD-10-CM

## 2017-01-20 DIAGNOSIS — Z8679 Personal history of other diseases of the circulatory system: Secondary | ICD-10-CM

## 2017-01-20 DIAGNOSIS — R0982 Postnasal drip: Secondary | ICD-10-CM

## 2017-01-20 DIAGNOSIS — H9202 Otalgia, left ear: Secondary | ICD-10-CM

## 2017-01-20 DIAGNOSIS — R059 Cough, unspecified: Secondary | ICD-10-CM

## 2017-01-20 MED ORDER — AMOXICILLIN 875 MG PO TABS
875.0000 mg | ORAL_TABLET | Freq: Two times a day (BID) | ORAL | 0 refills | Status: DC
Start: 2017-01-20 — End: 2018-02-12

## 2017-01-20 MED ORDER — ALBUTEROL SULFATE HFA 108 (90 BASE) MCG/ACT IN AERS: 2.0000 | INHALATION_SPRAY | Freq: Four times a day (QID) | RESPIRATORY_TRACT | 0 refills | Status: DC | PRN

## 2017-01-20 MED ORDER — FLUTICASONE PROPIONATE 50 MCG/ACT NA SUSP: 2.0000 | Freq: Every day | NASAL | 2 refills | Status: DC

## 2017-01-20 NOTE — Patient Instructions (Addendum)
Start using flonase and continue on Claritin.  Take the antibiotic.  Use the albuterol inhaler as needed for shortness of breath, wheezing or severe cough.   We will call you with the XR result.   I am referring you to the pulmonologist to further evaluate you for the cough and abnormal pulmonary function tests.  You will also get a call from the ENT for chronic sinus and ear infections.

## 2017-01-20 NOTE — Progress Notes (Signed)
   Subjective:    Patient ID: Lynn Hansen, female    DOB: 23-Jun-1976, 41 y.o.   MRN: [id number]  HPI Chief Complaint  Patient presents with  . discuss meds    discuss meds and clean out ears   She is here with complaints of a 2 week history of intermittent left ear pain, post nasal drainage and dry cough. States laying on her left ear sometimes triggers the pain. Pain feels like an ache and is non radiating. Ibuprofen helps pain temporarily.  Reports history of sinus and ear infections. States she does not feel like she has a sinus infection.  States she recalls being diagnosed with either allergies or asthma or bronchitis in the past. States at one point she was using Advair and albuterol inhaler. Not sure why she stopped.   History of cardiomegaly.   Denies fever, chills, headache, rhinorrhea, nasal congestion, sore throat, chest pain, palpitations, shortness of breath, wheezing, GI or GU symptoms. No LE edema.   Reviewed allergies, medications, past medical, and social history.    Review of Systems Pertinent positives and negatives in the history of present illness.     Objective:   Physical Exam  Constitutional: She is oriented to person, place, and time. She appears well-developed and well-nourished. No distress.  HENT:  Right Ear: External ear normal.  Left Ear: External ear normal.  Nose: Mucosal edema present. Right sinus exhibits no maxillary sinus tenderness and no frontal sinus tenderness. Left sinus exhibits no maxillary sinus tenderness and no frontal sinus tenderness.  Mouth/Throat: Uvula is midline, oropharynx is clear and moist and mucous membranes are normal.  Cerumen impaction bilaterally prior to ear lavage.   Eyes: Conjunctivae and EOM are normal. Pupils are equal, round, and reactive to light.  Cardiovascular: Normal rate, regular rhythm and normal heart sounds.   Pulmonary/Chest: Effort normal and breath sounds normal.  Neurological: She is alert and  oriented to person, place, and time.  Skin: Skin is warm and dry. No rash noted. No erythema. No pallor.   BP 124/82   Pulse 84   Temp 98.2 F (36.8 C) (Oral)   Resp 16   Wt 249 lb (112.9 kg)   SpO2 98%   BMI 42.74 kg/m       Assessment & Plan:  Otalgia, left ear - Plan: Ambulatory referral to ENT  Bilateral impacted cerumen  Post-nasal drainage - Plan: Ambulatory referral to ENT  Cough - Plan: Spirometry with graph, DG Chest 2 View, Ambulatory referral to Pulmonology  History of chronic sinusitis - Plan: Ambulatory referral to ENT  Abnormal PFT - Plan: DG Chest 2 View, Ambulatory referral to Pulmonology  History of cardiomegaly - Plan: DG Chest 2 View, Ambulatory referral to Pulmonology  Plan to treat for questionable acute otitis media. Amoxicillin prescribed.  Advised to use flonase and claritin for post nasal drainage and cough.  Bilateral ear lavage performed due to cerumen impaction. Patient tolerated procedure well.  Referral to ENT due to chronic sinusitis, recurrent ear infections and chronic post nasal drainage.  Referral to pulmonologist due to abnormal PFTs. Albuterol sent to pharmacy with instructions for use. Chest XR ordered.  Will follow up pending results.

## 2017-01-28 ENCOUNTER — Encounter: Payer: Self-pay | Admitting: Internal Medicine

## 2017-01-28 ENCOUNTER — Telehealth: Payer: Self-pay | Admitting: Internal Medicine

## 2017-01-28 ENCOUNTER — Other Ambulatory Visit (INDEPENDENT_AMBULATORY_CARE_PROVIDER_SITE_OTHER): Payer: 59

## 2017-01-28 ENCOUNTER — Ambulatory Visit (INDEPENDENT_AMBULATORY_CARE_PROVIDER_SITE_OTHER): Payer: 59 | Admitting: Internal Medicine

## 2017-01-28 VITALS — BP 160/90 | HR 90 | Ht 63.0 in | Wt 249.0 lb

## 2017-01-28 DIAGNOSIS — I10 Essential (primary) hypertension: Secondary | ICD-10-CM

## 2017-01-28 DIAGNOSIS — R05 Cough: Secondary | ICD-10-CM

## 2017-01-28 DIAGNOSIS — R058 Other specified cough: Secondary | ICD-10-CM

## 2017-01-28 LAB — CBC WITH DIFFERENTIAL/PLATELET
BASOS PCT: 0.4 % (ref 0.0–3.0)
Basophils Absolute: 0 10*3/uL (ref 0.0–0.1)
EOS ABS: 0.1 10*3/uL (ref 0.0–0.7)
Eosinophils Relative: 0.9 % (ref 0.0–5.0)
HCT: 37.1 % (ref 36.0–46.0)
Hemoglobin: 12.4 g/dL (ref 12.0–15.0)
LYMPHS ABS: 1.9 10*3/uL (ref 0.7–4.0)
Lymphocytes Relative: 28.1 % (ref 12.0–46.0)
MCHC: 33.5 g/dL (ref 30.0–36.0)
MCV: 80.7 fl (ref 78.0–100.0)
Monocytes Absolute: 0.5 10*3/uL (ref 0.1–1.0)
Monocytes Relative: 7.1 % (ref 3.0–12.0)
Neutro Abs: 4.2 10*3/uL (ref 1.4–7.7)
Neutrophils Relative %: 63.5 % (ref 43.0–77.0)
PLATELETS: 399 10*3/uL (ref 150.0–400.0)
RBC: 4.59 Mil/uL (ref 3.87–5.11)
RDW: 13.4 % (ref 11.5–15.5)
WBC: 6.6 10*3/uL (ref 4.0–10.5)

## 2017-01-28 MED ORDER — FAMOTIDINE 20 MG PO TABS: 20.0000 mg | ORAL_TABLET | Freq: Every day | ORAL | 0 refills | Status: DC

## 2017-01-28 MED ORDER — TRAMADOL HCL 50 MG PO TABS: ORAL_TABLET | ORAL | 0 refills | Status: DC

## 2017-01-28 MED ORDER — OMEPRAZOLE 20 MG PO CPDR: 20.0000 mg | DELAYED_RELEASE_CAPSULE | Freq: Every day | ORAL | 0 refills | Status: DC

## 2017-01-28 NOTE — Assessment & Plan Note (Addendum)
Upper airway cough syndrome (previously labeled PNDS) , is  so named because it's frequently impossible to sort out how much is  CR/sinusitis with freq throat clearing (which can be related to primary GERD)   vs  causing  secondary (" extra esophageal")  GERD from wide swings in gastric pressure that occur with throat clearing, often  promoting self use of mint and menthol lozenges that reduce the lower esophageal sphincter tone and exacerbate the problem further in a cyclical fashion.   These are the same pts (now being labeled as having "irritable larynx syndrome" by some cough centers) who not infrequently have a history of having failed to tolerate ace inhibitors,  dry powder inhalers or biphosphonates or report having atypical/extraesophageal reflux symptoms that don't respond to standard doses of PPI  and are easily confused as having aecopd or asthma flares by even experienced allergists/ pulmonologists (myself included).    First step is trial off acei and on arb/ short term elimination of cyclical cough and gerd then f/u if not improving by 2 weeks but check allergy profile for baseline since does have h/o seasonal rhinitis which may have well been the original trigger and likely to recur seasonally but not likely asthmatic.   Total time devoted to counseling  > 50 % of initial 60 min office visit:  review case with pt/ discussion of options/alternatives/ personally creating written customized instructions  in presence of pt  then going over those specific  Instructions directly with the pt including how to use all of the meds but in particular covering each new medication in detail and the difference between the maintenance= "automatic" meds and the prns using an action plan format for the latter (If this problem/symptom => do that organization reading Left to right).  Please see AVS from this visit for a full list of these instructions which I personally wrote for this pt and  are unique to this  visit.

## 2017-01-28 NOTE — Patient Instructions (Addendum)
Stop lisinopril  Start valsartan 160  - 25 one daily instead - can take this twice daily if blood pressure too high   Take delsym two tsp every 12 hours and supplement if needed with  tramadol 50 mg up to 2 every 4 hours to suppress the urge to cough. Swallowing water or using ice chips/non mint and menthol containing candies (such as lifesavers or sugarless jolly ranchers) are also effective.  You should rest your voice and avoid activities that you know make you cough.  Once you have eliminated the cough for 3 straight days try reducing the tramadol first,  then the delsym as tolerated.     Try prilosec otc 20mg   Take 30-60 min before first meal of the day and Pepcid ac (famotidine) 20 mg one @  bedtime until cough is completely gone for at least a week without the need for cough suppression     GERD (REFLUX)  is an extremely common cause of respiratory symptoms just like yours , many times with no obvious heartburn at all.    It can be treated with medication, but also with lifestyle changes including elevation of the head of your bed (ideally with 6 inch  bed blocks),  Smoking cessation, avoidance of late meals, excessive alcohol, and avoid fatty foods, chocolate, peppermint, colas, red wine, and acidic juices such as orange juice.  NO MINT OR MENTHOL PRODUCTS SO NO COUGH DROPS   USE SUGARLESS CANDY INSTEAD (Jolley ranchers or Stover's or Life Savers) or even ice chips will also do - the key is to swallow to prevent all throat clearing. NO OIL BASED VITAMINS - use powdered substitutes.  Please remember to go to the lab department downstairs in the basement  for your tests - we will call you with the results when they are available.     If you are satisfied with your treatment plan,  let your doctor know and he/she can either refill your medications or you can return here when your prescription runs out.     If in any way you are not 100% satisfied,  please tell us.  If 100% better, tell  your friends!  Pulmonary follow up is as needed

## 2017-01-28 NOTE — Assessment & Plan Note (Signed)
Body mass index is 44.11   trending level at this point  Lab Results  Component Value Date   TSH 1.32 01/08/2017     Contributing to gerd risk/ doe/reviewed the need and the process to achieve and maintain neg calorie balance > defer f/u primary care including intermittently monitoring thyroid status

## 2017-01-28 NOTE — Assessment & Plan Note (Addendum)
In the best review of chronic cough to date ( NEJM 2016 375 [phone]) ,  ACEi are now felt to cause cough in up to  20% of pts which is a 4 fold increase from previous reports and does not include the variety of non-specific complaints we see in pulmonary clinic in pts on ACEi but previously attributed to another dx like  Copd/asthma and  include PNDS, throat and chest congestion, "bronchitis", unexplained dyspnea and noct "strangling" sensations, and hoarseness, but also  atypical /refractory GERD symptoms like dysphagia and "bad heartburn"   The only way I know  to prove this is not an "ACEi Case" is a trial off ACEi x a minimum of 6 weeks then regroup.   Try valsartan 160 -25 one daily and take twice daily if not at target > Follow up per Primary Care planned

## 2017-01-28 NOTE — Progress Notes (Signed)
Subjective:     Patient ID: Lynn Hansen, female   DOB: 1976-08-05,   MRN: [id number]  HPI   89 yobf never smoker healthy except for seasonal rhintis but cough/wheeze spring= fall zyrtec usually effective  wt issues complicated by hbp around 2015-16  Then onset of cough sept 2017 while on ACEi and unable to come off it due to HA off it so referred to pulmonary clinic 01/28/2017 by Harland Dingwall   01/28/2017 1st [facility] office visit/ Elyas Villamor   Chief Complaint  Patient presents with  . Pulmonary Consult    Referred by Dr. Harland Dingwall. Pt c/o cough x 6 months. She states that she coughs so hard sometimes she gags and produces some clear sputum.  Cough sometimes wakes her from sleep.   cough is severe/ constant sense of something stuck in throat/ more day than night but also wakes her up at night / minimal mucus production Onset ? With fall allergies ? [place] and all resolved x for the cough  No better with zyrtec, abx, albuterol    No obvious day to day or daytime variability or assoc sob or excess/ purulent sputum or mucus plugs or hemoptysis or cp or chest tightness, subjective wheeze or overt sinus or hb symptoms. No unusual exp hx or h/o childhood pna/ asthma or knowledge of premature birth.  Also denies any obvious fluctuation of symptoms with weather or environmental changes or other aggravating or alleviating factors except as outlined above   Current Medications, Allergies, Complete Past Medical History, Past Surgical History, Family History, and Social History were reviewed in [employer] record.  ROS  The following are not active complaints unless bolded sore throat, dysphagia, dental problems, itching, sneezing,  nasal congestion or excess/ purulent secretions, ear ache,   fever, chills, sweats, unintended wt loss, classically pleuritic or exertional cp,  orthopnea pnd or leg swelling, presyncope, palpitations, abdominal pain, anorexia, nausea, vomiting,  diarrhea  or change in bowel or bladder habits, change in stools or urine, dysuria,hematuria,  rash, arthralgias, visual complaints, headache, numbness, weakness or ataxia or problems with walking or coordination,  change in mood/affect or memory.          Review of Systems     Objective:   Physical Exam    amb obese bf nad  Wt Readings from Last 3 Encounters:  01/28/17 249 lb (112.9 kg)  01/20/17 249 lb (112.9 kg)  01/08/17 248 lb (112.5 kg)    Vital signs reviewed  - Note on arrival 02 sats  99% on RA    HEENT: nl dentition, turbinates bilaterally, and oropharynx. Nl external ear canals without cough reflex   NECK :  without JVD/Nodes/TM/ nl carotid upstrokes bilaterally   LUNGS: no acc muscle use,  Nl contour chest which is clear to A and P bilaterally without cough on insp or exp maneuvers   CV:  RRR  no s3 or murmur or increase in P2, and no edema   ABD:  soft and nontender with nl inspiratory excursion in the supine position. No bruits or organomegaly appreciated, bowel sounds nl  MS:  Nl gait/ ext warm without deformities, calf tenderness, cyanosis or clubbing No obvious joint restrictions   SKIN: warm and dry without lesions    NEURO:  alert, approp, nl sensorium with  no motor or cerebellar deficits apparent.       I personally reviewed images and agree with radiology impression as follows:  CXR:   01/20/17  No acute disease    Assessment:

## 2017-01-28 NOTE — Telephone Encounter (Signed)
Pt. Went to the pharmacy and was concerned about the cost of the Pepcid and Prilosec and she stated she spoke with the pharmacist who stated she can try to run this through her insurance and it might cut the cost for her. Sent in a rx for the pt. Nothing further is needed a this time.        3. Tanda Rockers, MD (Physician) at 01/28/2017 9:36 AM - Signed    Stop lisinopril  Start valsartan 160  - 25 one daily instead - can take this twice daily if blood pressure too high   Take delsym two tsp every 12 hours and supplement if needed with  tramadol 50 mg up to 2 every 4 hours to suppress the urge to cough. Swallowing water or using ice chips/non mint and menthol containing candies (such as lifesavers or sugarless jolly ranchers) are also effective.  You should rest your voice and avoid activities that you know make you cough.  Once you have eliminated the cough for 3 straight days try reducing the tramadol first,  then the delsym as tolerated.     Try prilosec otc 20mg   Take 30-60 min before first meal of the day and Pepcid ac (famotidine) 20 mg one @  bedtime until cough is completely gone for at least a week without the need for cough suppression     GERD (REFLUX)  is an extremely common cause of respiratory symptoms just like yours , many times with no obvious heartburn at all.    It can be treated with medication, but also with lifestyle changes including elevation of the head of your bed (ideally with 6 inch  bed blocks),  Smoking cessation, avoidance of late meals, excessive alcohol, and avoid fatty foods, chocolate, peppermint, colas, red wine, and acidic juices such as orange juice.  NO MINT OR MENTHOL PRODUCTS SO NO COUGH DROPS   USE SUGARLESS CANDY INSTEAD (Jolley ranchers or Stover's or Life Savers) or even ice chips will also do - the key is to swallow to prevent all throat clearing. NO OIL BASED VITAMINS - use powdered substitutes.  Please remember to go to the lab  department downstairs in the basement  for your tests - we will call you with the results when they are available.     If you are satisfied with your treatment plan,  let your doctor know and he/she can either refill your medications or you can return here when your prescription runs out.     If in any way you are not 100% satisfied,  please tell us.  If 100% better, tell your friends!  Pulmonary follow up is as needed

## 2017-01-30 LAB — RESPIRATORY ALLERGY PROFILE REGION II ~~LOC~~
Allergen, A. alternata, m6: <0.1 kU/L
Allergen, Cedar tree, t12: <0.1 kU/L
Allergen, Mouse Urine Protein, e78: <0.1 kU/L
Allergen, Mulberry, t76: <0.1 kU/L
Allergen, Oak,t7: <0.1 kU/L
Allergen, P. notatum, m1: <0.1 kU/L
Aspergillus fumigatus, m3: <0.1 kU/L
Box Elder IgE: <0.1 kU/L
Cat Dander: <0.1 kU/L
Cockroach: <0.1 kU/L
Common Ragweed: <0.1 kU/L
IGE (IMMUNOGLOBULIN E), SERUM: 25 kU/L (ref <115)
Johnson Grass: <0.1 kU/L
Rough Pigweed  IgE: <0.1 kU/L
Sheep Sorrel IgE: <0.1 kU/L

## 2017-02-02 NOTE — Progress Notes (Signed)
Spoke with patient and informed her of results. Pt did not have any additional questions. Nothing further is needed.

## 2017-02-10 ENCOUNTER — Ambulatory Visit: Payer: 59 | Admitting: Family Medicine

## 2017-02-17 ENCOUNTER — Encounter: Payer: Self-pay | Admitting: Internal Medicine

## 2017-04-29 ENCOUNTER — Encounter: Payer: Self-pay | Admitting: Gynecology

## 2018-02-11 ENCOUNTER — Telehealth: Payer: Self-pay | Admitting: Obstetrics and Gynecology

## 2018-02-11 NOTE — Telephone Encounter (Signed)
Note routed to triage in error.

## 2018-02-11 NOTE — Telephone Encounter (Signed)
New patient, first appointment 02/18/18. Having discharge and would like to come in sooner.

## 2018-02-12 ENCOUNTER — Ambulatory Visit: Payer: BC Managed Care – PPO | Admitting: Certified Nurse Midwife

## 2018-02-12 ENCOUNTER — Encounter: Payer: Self-pay | Admitting: Certified Nurse Midwife

## 2018-02-12 ENCOUNTER — Other Ambulatory Visit: Payer: Self-pay

## 2018-02-12 VITALS — BP 120/70 | HR 70 | Resp 16 | Ht 63.25 in | Wt 251.0 lb

## 2018-02-12 DIAGNOSIS — Z01419 Encounter for gynecological examination (general) (routine) without abnormal findings: Secondary | ICD-10-CM

## 2018-02-12 DIAGNOSIS — B373 Candidiasis of vulva and vagina: Secondary | ICD-10-CM

## 2018-02-12 DIAGNOSIS — B3731 Acute candidiasis of vulva and vagina: Secondary | ICD-10-CM

## 2018-02-12 MED ORDER — FLUCONAZOLE 150 MG PO TABS
ORAL_TABLET | ORAL | 0 refills | Status: DC
Start: 2018-02-12 — End: 2018-02-17

## 2018-02-12 NOTE — Patient Instructions (Signed)

## 2018-02-12 NOTE — Progress Notes (Signed)
42 y.o. Single African American female [phone] here to establish gyn care and here for problem only. Patient has appointment for aex here in one week Patient has history of abnormal pap smears with LEEP and now is doing pap smears yearly. Having vaginal symptoms today with complaint of vaginal  irritation , and increase white non odorous discharge. Onset of symptoms 5 days ago. Denies new personal products except soap change. Sexually active, no new partner. No  STD concerns. Urinary symptoms none . Contraception is condom consistent use.  ROS Pertinent to HPI and history.  O:Healthy female WDWN Affect: normal, orientation x 3  Exam:Healthy WDWN obese female Abdomen: soft, non tender, no masses  Inguinal Lymph nodes: no enlargement or tenderness Pelvic exam: External genital: normal female, no lesions or scaling or exudate noted BUS: negative Vagina: copious white slight odorous discharge noted. Ph:4.0   ,Wet prep taken, Affirm taken Cervix: normal, non tender, no CMT Uterus: normal, non tender Adnexa:normal, non tender, no masses or fullness noted   Wet Prep results: KOH,Saline + yeast   A:Normal pelvic exam Contraception condoms Yeast vaginitis  History of abnormal pap smear with LEEP on yearly pap smears now Aex scheduled here in one week     P:Discussed findings of yeast vaginitis finding and etiology. Discussed Aveeno or baking soda sitz bath for comfort. Avoid moist clothes or pads for extended period of time. If working out in gym clothes  for long periods of time change underwear or bottoms. Questions addressed. Rx: Diflucan see order with instructions Lab: Affirm  Rv prn

## 2018-02-13 LAB — VAGINITIS/VAGINOSIS, DNA PROBE
Candida Species: NEGATIVE
Gardnerella vaginalis: NEGATIVE
TRICHOMONAS VAG: NEGATIVE

## 2018-02-16 ENCOUNTER — Ambulatory Visit (INDEPENDENT_AMBULATORY_CARE_PROVIDER_SITE_OTHER): Payer: BC Managed Care – PPO | Admitting: Certified Nurse Midwife

## 2018-02-16 ENCOUNTER — Other Ambulatory Visit: Payer: Self-pay | Admitting: Certified Nurse Midwife

## 2018-02-16 ENCOUNTER — Other Ambulatory Visit: Payer: Self-pay

## 2018-02-16 ENCOUNTER — Other Ambulatory Visit (HOSPITAL_COMMUNITY)
Admission: RE | Admit: 2018-02-16 | Discharge: 2018-02-16 | Disposition: A | Discharge status: Home / Self Care | Payer: BC Managed Care – PPO | Source: Ambulatory Visit | Attending: Obstetrics & Gynecology | Admitting: Obstetrics & Gynecology

## 2018-02-16 ENCOUNTER — Encounter: Payer: Self-pay | Admitting: Certified Nurse Midwife

## 2018-02-16 VITALS — BP 120/76 | HR 70 | Resp 16 | Ht 63.25 in | Wt 253.0 lb

## 2018-02-16 DIAGNOSIS — Z01419 Encounter for gynecological examination (general) (routine) without abnormal findings: Secondary | ICD-10-CM

## 2018-02-16 DIAGNOSIS — B373 Candidiasis of vulva and vagina: Secondary | ICD-10-CM

## 2018-02-16 DIAGNOSIS — Z124 Encounter for screening for malignant neoplasm of cervix: Secondary | ICD-10-CM

## 2018-02-16 DIAGNOSIS — B3731 Acute candidiasis of vulva and vagina: Secondary | ICD-10-CM

## 2018-02-16 NOTE — Patient Instructions (Signed)
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
## 2018-02-17 ENCOUNTER — Telehealth: Payer: Self-pay | Admitting: Certified Nurse Midwife

## 2018-02-17 DIAGNOSIS — B3731 Acute candidiasis of vulva and vagina: Secondary | ICD-10-CM

## 2018-02-17 DIAGNOSIS — B373 Candidiasis of vulva and vagina: Secondary | ICD-10-CM

## 2018-02-17 MED ORDER — FLUCONAZOLE 150 MG PO TABS: ORAL_TABLET | ORAL | 0 refills | Status: DC

## 2018-02-17 NOTE — Telephone Encounter (Signed)
Patient left message on voicemail asking for a return call from nurse.

## 2018-02-17 NOTE — Telephone Encounter (Signed)
New Rx for Diflucan 150mg  #1,0R sent to pharmacy on file.

## 2018-02-17 NOTE — Telephone Encounter (Signed)
Ok to send another Diflucan if still symptomatic

## 2018-02-17 NOTE — Telephone Encounter (Signed)
Patient states was seen on 02-12-18 for vaginitis and Rx for Diflucan given. She was suppose to have gotten 2 tablets but Rx was sent for only 1 tablet. Patient feeling better but not completely resolved and requesting second tablet. Routed to D.Hollice Espy, CNM

## 2018-02-18 ENCOUNTER — Encounter: Payer: Self-pay | Admitting: Obstetrics and Gynecology

## 2018-02-19 ENCOUNTER — Other Ambulatory Visit: Payer: Self-pay | Admitting: Certified Nurse Midwife

## 2018-02-19 DIAGNOSIS — R87612 Low grade squamous intraepithelial lesion on cytologic smear of cervix (LGSIL): Secondary | ICD-10-CM

## 2018-02-19 LAB — CYTOLOGY - PAP: HPV (WINDOPATH): DETECTED — AB

## 2018-02-22 ENCOUNTER — Telehealth: Payer: Self-pay | Admitting: Certified Nurse Midwife

## 2018-02-22 NOTE — Telephone Encounter (Signed)
Spoke with patient. Treated for yeast, diflucan x2, last dose on 3/6. Patient states symptoms initially resolved. Symptoms returned on 3/10, describes vaginal irritation, clear/white vaginal d/c, no odor. Has used a new detergent and baking soda baths since last dose of diflucan.   Recommended OV for further evaluation. Patient declined OV for 3/14 with Melvia Heaps, CNM, request OV for 2:30PM or later with first available provider.    OV scheduled for 3/12 at 2:30pm with Dr. Sabra Heck.   Routing to provider for final review. Patient is agreeable to disposition. Will close encounter.  Cc: Melvia Heaps, CNM

## 2018-02-22 NOTE — Telephone Encounter (Signed)
Patient last seen 02/16/18 with yeast infection, still having symptoms.

## 2018-02-23 ENCOUNTER — Other Ambulatory Visit: Payer: Self-pay

## 2018-02-23 ENCOUNTER — Ambulatory Visit: Payer: BC Managed Care – PPO | Admitting: Obstetrics & Gynecology

## 2018-02-23 ENCOUNTER — Encounter: Payer: Self-pay | Admitting: Obstetrics & Gynecology

## 2018-02-23 VITALS — BP 140/86 | HR 84 | Temp 98.2°F | Resp 16 | Wt 254.0 lb

## 2018-02-23 DIAGNOSIS — N898 Other specified noninflammatory disorders of vagina: Secondary | ICD-10-CM | POA: Diagnosis not present

## 2018-02-23 DIAGNOSIS — L292 Pruritus vulvae: Secondary | ICD-10-CM

## 2018-02-23 MED ORDER — HYDROCORTISONE ACETATE 25 MG RE SUPP: RECTAL | 0 refills | Status: DC
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
## 2018-02-24 LAB — VAGINITIS/VAGINOSIS, DNA PROBE
CANDIDA SPECIES: NEGATIVE
GARDNERELLA VAGINALIS: NEGATIVE
TRICHOMONAS VAG: NEGATIVE

## 2018-03-02 ENCOUNTER — Telehealth: Payer: Self-pay | Admitting: Certified Nurse Midwife

## 2018-03-02 NOTE — Telephone Encounter (Signed)
Patient called and left message stating that she was calling to notify of the first day of her cycle on 02/28/18.

## 2018-03-02 NOTE — Telephone Encounter (Signed)
Spoke with patient. Patient started her menses on 02/28/2018 and would like to schedule colposcopy. Appointment scheduled for 03/11/2018 at 3:15 pm with Melvia Heaps CNM. Patient is agreeable to date and time.   Instructions given. Motrin 800 mg po x , one hour before appointment with food. Make sure to eat a meal before appointment and drink plenty of fluids. Patient verbalized understanding and will call to reschedule if will be on menses or has any concerns regarding pregnancy. Patient agreeable and verbalized understanding of all instructions.   Routing to provider for final review. Patient agreeable to disposition. Will close encounter.

## 2018-03-08 ENCOUNTER — Ambulatory Visit: Payer: BC Managed Care – PPO | Admitting: Family Medicine

## 2018-03-08 ENCOUNTER — Encounter: Payer: Self-pay | Admitting: Family Medicine

## 2018-03-08 VITALS — BP 126/82 | HR 83 | Temp 98.3°F | Resp 12 | Ht 64.0 in | Wt 253.5 lb

## 2018-03-08 DIAGNOSIS — I10 Essential (primary) hypertension: Secondary | ICD-10-CM | POA: Diagnosis not present

## 2018-03-08 DIAGNOSIS — J309 Allergic rhinitis, unspecified: Secondary | ICD-10-CM | POA: Diagnosis not present

## 2018-03-08 DIAGNOSIS — J4520 Mild intermittent asthma, uncomplicated: Secondary | ICD-10-CM | POA: Diagnosis not present

## 2018-03-08 LAB — BASIC METABOLIC PANEL
BUN: 8 mg/dL (ref 6–23)
CO2: 28 mEq/L (ref 19–32)
Calcium: 8.7 mg/dL (ref 8.4–10.5)
Chloride: 104 mEq/L (ref 96–112)
Creatinine, Ser: 0.51 mg/dL (ref 0.40–1.20)
GFR: 170.59 mL/min (ref >60.00)
GLUCOSE: 114 mg/dL — AB (ref 70–99)
POTASSIUM: 3.5 meq/L (ref 3.5–5.1)
Sodium: 139 mEq/L (ref 135–145)

## 2018-03-08 MED ORDER — FLUTICASONE PROPIONATE 50 MCG/ACT NA SUSP: 2.0000 | Freq: Every day | NASAL | 6 refills | Status: DC

## 2018-03-08 MED ORDER — ALBUTEROL SULFATE HFA 108 (90 BASE) MCG/ACT IN AERS: 2.0000 | INHALATION_SPRAY | Freq: Four times a day (QID) | RESPIRATORY_TRACT | 2 refills | Status: DC | PRN

## 2018-03-08 NOTE — Assessment & Plan Note (Signed)
Well-controlled with nonpharmacologic treatment. Low-salt and DASH diet recommended, handout given. Recommend monitoring BP at home.

## 2018-03-08 NOTE — Assessment & Plan Note (Signed)
We discussed benefits of wt loss as well as adverse effects of obesity. Consistency with healthy diet and physical activity recommended. Daily brisk walking for 15-30 min as tolerated.

## 2018-03-08 NOTE — Assessment & Plan Note (Signed)
Symptomatic. Flonase nasal spray to use daily for now. Zyrtec 10 mg daily in the morning. Saline nasal irrigations as needed. She was instructed to let me know in about 3 weeks if symptoms are well controlled with this regimen, if not we will consider adding Singulair 10 mg at bedtime.

## 2018-03-08 NOTE — Patient Instructions (Addendum)
A few things to remember from today's visit:   Allergic rhinitis, unspecified seasonality, unspecified trigger - Plan: fluticasone (FLONASE) 50 MCG/ACT nasal spray  Essential hypertension  Asthma, intermittent, uncomplicated  Zyrtec 10 mg daily and saline irrigations. Please let me know in 3 weeks how whether or no symptoms improve.   DASH Eating Plan DASH stands for "Dietary Approaches to Stop Hypertension." The DASH eating plan is a healthy eating plan that has been shown to reduce high blood pressure (hypertension). It may also reduce your risk for type 2 diabetes, heart disease, and stroke. The DASH eating plan may also help with weight loss. What are tips for following this plan? General guidelines  Avoid eating more than 2,300 mg (milligrams) of salt (sodium) a day. If you have hypertension, you may need to reduce your sodium intake to 1,500 mg a day.  Limit alcohol intake to no more than 1 drink a day for nonpregnant women and 2 drinks a day for men. One drink equals 12 oz of beer, 5 oz of wine, or 1 oz of hard liquor.  Work with your health care provider to maintain a healthy body weight or to lose weight. Ask what an ideal weight is for you.  Get at least 30 minutes of exercise that causes your heart to beat faster (aerobic exercise) most days of the week. Activities may include walking, swimming, or biking.  Work with your health care provider or diet and nutrition specialist (dietitian) to adjust your eating plan to your individual calorie needs. Reading food labels  Check food labels for the amount of sodium per serving. Choose foods with less than 5 percent of the Daily Value of sodium. Generally, foods with less than 300 mg of sodium per serving fit into this eating plan.  To find whole grains, look for the word "whole" as the first word in the ingredient list. Shopping  Buy products labeled as "low-sodium" or "no salt added."  Buy fresh foods. Avoid canned foods  and premade or frozen meals. Cooking  Avoid adding salt when cooking. Use salt-free seasonings or herbs instead of table salt or sea salt. Check with your health care provider or pharmacist before using salt substitutes.  Do not fry foods. Cook foods using healthy methods such as baking, boiling, grilling, and broiling instead.  Cook with heart-healthy oils, such as olive, canola, soybean, or sunflower oil. Meal planning   Eat a balanced diet that includes: ? 5 or more servings of fruits and vegetables each day. At each meal, try to fill half of your plate with fruits and vegetables. ? Up to 6-8 servings of whole grains each day. ? Less than 6 oz of lean meat, poultry, or fish each day. A 3-oz serving of meat is about the same size as a deck of cards. One egg equals 1 oz. ? 2 servings of low-fat dairy each day. ? A serving of nuts, seeds, or beans 5 times each week. ? Heart-healthy fats. Healthy fats called Omega-3 fatty acids are found in foods such as flaxseeds and coldwater fish, like sardines, salmon, and mackerel.  Limit how much you eat of the following: ? Canned or prepackaged foods. ? Food that is high in trans fat, such as fried foods. ? Food that is high in saturated fat, such as fatty meat. ? Sweets, desserts, sugary drinks, and other foods with added sugar. ? Full-fat dairy products.  Do not salt foods before eating.  Try to eat at least 2 vegetarian  meals each week.  Eat more home-cooked food and less restaurant, buffet, and fast food.  When eating at a restaurant, ask that your food be prepared with less salt or no salt, if possible. What foods are recommended? The items listed may not be a complete list. Talk with your dietitian about what dietary choices are best for you. Grains Whole-grain or whole-wheat bread. Whole-grain or whole-wheat pasta. Brown rice. [place]. Bulgur. Whole-grain and low-sodium cereals. Pita bread. Low-fat, low-sodium crackers.  Whole-wheat flour tortillas. Vegetables Fresh or frozen vegetables (raw, steamed, roasted, or grilled). Low-sodium or reduced-sodium tomato and vegetable juice. Low-sodium or reduced-sodium tomato sauce and tomato paste. Low-sodium or reduced-sodium canned vegetables. Fruits All fresh, dried, or frozen fruit. Canned fruit in natural juice (without added sugar). Meat and other protein foods Skinless chicken or [place]. Ground chicken or [place]. Pork with fat trimmed off. Fish and seafood. Egg whites. Dried beans, peas, or lentils. Unsalted nuts, nut butters, and seeds. Unsalted canned beans. Lean cuts of beef with fat trimmed off. Low-sodium, lean deli meat. Dairy Low-fat (1%) or fat-free (skim) milk. Fat-free, low-fat, or reduced-fat cheeses. Nonfat, low-sodium ricotta or cottage cheese. Low-fat or nonfat yogurt. Low-fat, low-sodium cheese. Fats and oils Soft margarine without trans fats. Vegetable oil. Low-fat, reduced-fat, or light mayonnaise and salad dressings (reduced-sodium). Canola, safflower, olive, soybean, and sunflower oils. Avocado. Seasoning and other foods Herbs. Spices. Seasoning mixes without salt. Unsalted popcorn and pretzels. Fat-free sweets. What foods are not recommended? The items listed may not be a complete list. Talk with your dietitian about what dietary choices are best for you. Grains Baked goods made with fat, such as croissants, muffins, or some breads. Dry pasta or rice meal packs. Vegetables Creamed or fried vegetables. Vegetables in a cheese sauce. Regular canned vegetables (not low-sodium or reduced-sodium). Regular canned tomato sauce and paste (not low-sodium or reduced-sodium). Regular tomato and vegetable juice (not low-sodium or reduced-sodium). Angie Fava. Olives. Fruits Canned fruit in a light or heavy syrup. Fried fruit. Fruit in cream or butter sauce. Meat and other protein foods Fatty cuts of meat. Ribs. Fried meat. Berniece Salines. Sausage. Bologna and other  processed lunch meats. Salami. Fatback. Hotdogs. Bratwurst. Salted nuts and seeds. Canned beans with added salt. Canned or smoked fish. Whole eggs or egg yolks. Chicken or [place] with skin. Dairy Whole or 2% milk, cream, and half-and-half. Whole or full-fat cream cheese. Whole-fat or sweetened yogurt. Full-fat cheese. Nondairy creamers. Whipped toppings. Processed cheese and cheese spreads. Fats and oils Butter. Stick margarine. Lard. Shortening. Ghee. Bacon fat. Tropical oils, such as coconut, palm kernel, or palm oil. Seasoning and other foods Salted popcorn and pretzels. Onion salt, garlic salt, seasoned salt, table salt, and sea salt. Worcestershire sauce. Tartar sauce. Barbecue sauce. Teriyaki sauce. Soy sauce, including reduced-sodium. Steak sauce. Canned and packaged gravies. Fish sauce. Oyster sauce. Cocktail sauce. Horseradish that you find on the shelf. Ketchup. Mustard. Meat flavorings and tenderizers. Bouillon cubes. Hot sauce and Tabasco sauce. Premade or packaged marinades. Premade or packaged taco seasonings. Relishes. Regular salad dressings. Where to find more information:  National Heart, Lung, and [place]: https://wilson-eaton.com/  American Heart Association: www.heart.org Summary  The DASH eating plan is a healthy eating plan that has been shown to reduce high blood pressure (hypertension). It may also reduce your risk for type 2 diabetes, heart disease, and stroke.  With the DASH eating plan, you should limit salt (sodium) intake to 2,300 mg a day. If you have hypertension, you may need to  reduce your sodium intake to 1,500 mg a day.  When on the DASH eating plan, aim to eat more fresh fruits and vegetables, whole grains, lean proteins, low-fat dairy, and heart-healthy fats.  Work with your health care provider or diet and nutrition specialist (dietitian) to adjust your eating plan to your individual calorie needs. This information is not intended to replace advice given to  you by your health care provider. Make sure you discuss any questions you have with your health care provider. Document Released: 11/20/2011 Document Revised: 11/24/2016 Document Reviewed: 11/24/2016 Elsevier Interactive Patient Education  [employer].  Please be sure medication list is accurate. If a new problem present, please set up appointment sooner than planned today.

## 2018-03-08 NOTE — Progress Notes (Signed)
HPI:   [name] is a 42 y.o. female, who is here today to establish care.  Former PCP: N/A Last preventive routine visit: She follows with gynecologist regularly, Dr. Hollice Hansen. Last female preventive visit on 02/16/2018.    Chronic medical problems: Upper airway cough syndrome, hypertension, obesity, IFG, asthma, and GERD among some. She follows with pulmonologist, Dr. Melvyn Hansen.  Concerns today: Allergies.  Related rhinitis, she has not on treatment currently. She is not sure which OTC antihistaminic to take. She has been having intermittent "teary" eyes, itchy eyes, itchy throat and ears.  External nasal congestion, rhinorrhea, postnasal drainage, and nonproductive cough. No shake contacts or recent travel. She has not had fever, chills, or body aches.    She does not exercise regularly has not been consistent with a healthy diet.  She eats fast food frequently.  Hypertension: Currently she is on nonpharmacologic treatment. She is reporting "normal" BP readings at home. In the past she took antihypertensive medication, she has not taken any for about a year.  Denies severe/frequent headache, visual changes, chest pain, dyspnea, palpitation, claudication, focal weakness, or edema.   Asthma: Currently she has not noted wheezing or dyspnea. Wheezing episodes usually around fall. She uses Albuterol inhaler as needed.    Review of Systems  Constitutional: Negative for activity change, appetite change, fatigue and fever.  HENT: Positive for congestion, postnasal drip, rhinorrhea, sneezing and voice change. Negative for ear pain, mouth sores, nosebleeds, sinus pressure, sore throat and trouble swallowing.   Eyes: Positive for discharge and itching. Negative for redness.  Respiratory: Positive for cough. Negative for shortness of breath and wheezing.   Cardiovascular: Negative.   Gastrointestinal: Negative for abdominal pain, diarrhea, nausea and vomiting.    Endocrine: Negative for cold intolerance and heat intolerance.  Genitourinary: Negative for decreased urine volume, dysuria and hematuria.  Musculoskeletal: Negative for gait problem and myalgias.  Skin: Negative for rash.  Allergic/Immunologic: Positive for environmental allergies.  Neurological: Negative for syncope, weakness and headaches.  Hematological: Negative for adenopathy. Does not bruise/bleed easily.      Current Outpatient Medications on File Prior to Visit  Medication Sig Dispense Refill  . CRANBERRY PO Take by mouth.    . Multiple Vitamins-Minerals (MULTIVITAMIN PO) Take by mouth.    . Probiotic Product (PROBIOTIC PO) Take by mouth.    . hydrocortisone (ANUSOL-HC) 25 MG suppository One suppository vaginally nightly as directed 12 suppository 0   No current facility-administered medications on file prior to visit.      Past Medical History:  Diagnosis Date  . Blood transfusion without reported diagnosis   . Cardiomegaly   . 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 Fibroid   . 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 HSV (herpes simplex virus) infection   . Hypertension   . Migraines   . Sebaceous cyst of ear    right ear   No Known Allergies  Family History  Adopted: Yes  Problem Relation Age of Onset  . Asthma Mother   . Hypertension Mother   . Miscarriages / [place] Mother   . Hypertension Father   . Learning disabilities Sister   . Depression Brother     Social History   Socioeconomic History  . Marital status: Single    Spouse name: Not on file  . Number of children: 1  .  Years of education: Not on file  . Highest education level: Not on file  Occupational History  . Not on file  Social Needs  . Financial resource strain: Not on file  . Food insecurity:    Worry: Not on file    Inability: Not on file  .  Transportation needs:    Medical: Not on file    Non-medical: Not on file  Tobacco Use  . Smoking status: Never Smoker  . Smokeless tobacco: Never Used  Substance and Sexual Activity  . Alcohol use: Not Currently  . Drug use: No  . Sexual activity: Yes    Partners: Male    Birth control/protection: Condom  Lifestyle  . Physical activity:    Days per week: Not on file    Minutes per session: Not on file  . Stress: Not on file  Relationships  . Social connections:    Talks on phone: Not on file    Gets together: Not on file    Attends religious service: Not on file    Active member of club or organization: Not on file    Attends meetings of clubs or organizations: Not on file    Relationship status: Not on file  Other Topics Concern  . Not on file  Social History Narrative  . Not on file    Vitals:   03/08/18 0808  BP: 126/82  Pulse: 83  Resp: 12  Temp: 98.3 F (36.8 C)  SpO2: 96%    Body mass index is 43.51 kg/m.    Physical Exam  Nursing note and vitals reviewed. Constitutional: She is oriented to person, place, and time. She appears well-developed. No distress.  HENT:  Head: Normocephalic and atraumatic.  Right Ear: Tympanic membrane, external ear and ear canal normal.  Left Ear: Tympanic membrane, external ear and ear canal normal.  Mouth/Throat: Oropharynx is clear and moist and mucous membranes are normal.  Hypertrophic turbinates. Nasal voice. Postnasal drainage.  Eyes: Pupils are equal, round, and reactive to light. Conjunctivae are normal.  Bilateral epiphora.  Cardiovascular: Normal rate and regular rhythm.  No murmur heard. Pulses:      Dorsalis pedis pulses are 2+ on the right side, and 2+ on the left side.  Respiratory: Effort normal and breath sounds normal. No respiratory distress.  GI: Soft. She exhibits no mass. There is no hepatomegaly. There is no tenderness.  Musculoskeletal: She exhibits edema (Trace pitting edema, bilateral). She  exhibits no tenderness.  Lymphadenopathy:    She has no cervical adenopathy.  Neurological: She is alert and oriented to person, place, and time. She has normal strength. Coordination normal.  Skin: Skin is warm. No rash noted. No erythema.  Psychiatric: She has a normal mood and affect.  Well groomed, good eye contact.     ASSESSMENT AND PLAN:   [name] was seen today for establish care.  Orders Placed This Encounter  Procedures  . Basic metabolic panel   Lab Results  Component Value Date   CREATININE 0.51 03/08/2018   BUN 8 03/08/2018   NA 139 03/08/2018   K 3.5 03/08/2018   CL 104 03/08/2018   CO2 28 03/08/2018     Morbid (severe) obesity due to excess calories ([place]) We discussed benefits of wt loss as well as adverse effects of obesity. Consistency with healthy diet and physical activity recommended. Daily brisk walking for 15-30 min as tolerated.   Asthma, intermittent, uncomplicated Well-controlled. Albuterol inhaler to continue as needed. Continue following  with Dr. Melvyn Hansen.  Allergic rhinitis Symptomatic. Flonase nasal spray to use daily for now. Zyrtec 10 mg daily in the morning. Saline nasal irrigations as needed. She was instructed to let me know in about 3 weeks if symptoms are well controlled with this regimen, if not we will consider adding Singulair 10 mg at bedtime.  Essential hypertension Well-controlled with nonpharmacologic treatment. Low-salt and DASH diet recommended, handout given. Recommend monitoring BP at home.       Lynn Hansen G. [place], MD  [facility]. [place] office.

## 2018-03-08 NOTE — Assessment & Plan Note (Signed)
Well-controlled. Albuterol inhaler to continue as needed. Continue following with Dr. Melvyn Novas.

## 2018-03-11 ENCOUNTER — Other Ambulatory Visit: Payer: Self-pay

## 2018-03-11 ENCOUNTER — Ambulatory Visit: Payer: BC Managed Care – PPO | Admitting: Certified Nurse Midwife

## 2018-03-11 ENCOUNTER — Encounter: Payer: Self-pay | Admitting: Certified Nurse Midwife

## 2018-03-11 VITALS — BP 120/80 | HR 70 | Resp 16 | Ht 63.25 in | Wt 253.0 lb

## 2018-03-11 DIAGNOSIS — R87612 Low grade squamous intraepithelial lesion on cytologic smear of cervix (LGSIL): Secondary | ICD-10-CM | POA: Diagnosis not present

## 2018-03-11 DIAGNOSIS — Z01812 Encounter for preprocedural laboratory examination: Secondary | ICD-10-CM

## 2018-03-11 LAB — POCT URINE PREGNANCY: PREG TEST UR: NEGATIVE
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
## 2018-03-11 NOTE — Patient Instructions (Signed)

## 2018-03-11 NOTE — Progress Notes (Signed)
Previous hx of CIN1, LEEP 02-16-18 LGSIL HPV HR- UPT-neg Pt took 800mg  ibuprofen at 2:30pm

## 2018-03-16 ENCOUNTER — Telehealth: Payer: Self-pay

## 2018-03-16 NOTE — Telephone Encounter (Signed)
-----   Message from Regina Eck, CNM sent at 03/16/2018  7:42 AM EDT ----- Notify patient colposcopy  Biopsy showed LSIL, mild dysplasia with HPV effect Ecc showed benign endocervical glandular and squamous epithelium No other evaluation at this point. She needs follow up pap in one year. She may want to consider Gardasil series, we discussed at colpo, she will need to check insurance benefits 08 recall

## 2018-03-16 NOTE — Telephone Encounter (Signed)
Spoke with patient. Advised of results as seen below from [place]. Patient verbalizes understanding. 08 recall placed. Will call about Gardasil injection and return call if she would like to proceed. Encounter closed.

## 2018-03-17 ENCOUNTER — Telehealth: Payer: Self-pay | Admitting: Certified Nurse Midwife

## 2018-03-17 NOTE — Telephone Encounter (Signed)
She may just be having some ph change from the solutions use on cervix. Would recommend Monistat external cream and inside labia area and see if this resolves the symptoms, otherwise would need to be seen

## 2018-03-17 NOTE — Telephone Encounter (Signed)
Patient called requesting a refill on Diflucan. Requested [facility] on [address].

## 2018-03-17 NOTE — Telephone Encounter (Signed)
Spoke with patient. Seen in office for colpo on 03/11/18. Reports white, clumpy, vaginal d/c, vaginal discomfort and itching started 2 days ago. D/c increased today. Denies odor. Patient requesting Rx for diflucan, advised OV needed for further evaluation. Last vaginitis testing 02/23/18, negative. Patient declined OV, request to review with Melvia Heaps, CNM. Advised will review with provider and return call.  Melvia Heaps, CNM -please advise on diflucan RX?

## 2018-03-17 NOTE — Telephone Encounter (Signed)
Left detailed message, ok per dpr, name identified on dpr. Advised as seen below per Melvia Heaps, CNM. Return call to office if any additional questions or to schedule OV. Will close encounter.

## 2018-05-11 ENCOUNTER — Other Ambulatory Visit: Payer: Self-pay | Admitting: Certified Nurse Midwife

## 2018-05-11 DIAGNOSIS — Z1231 Encounter for screening mammogram for malignant neoplasm of breast: Secondary | ICD-10-CM

## 2018-05-13 ENCOUNTER — Encounter: Payer: Self-pay | Admitting: Family Medicine

## 2018-05-13 ENCOUNTER — Ambulatory Visit: Payer: BC Managed Care – PPO | Admitting: Family Medicine

## 2018-05-13 VITALS — BP 126/70 | HR 86 | Temp 98.6°F | Resp 12 | Ht 63.25 in | Wt 242.4 lb

## 2018-05-13 DIAGNOSIS — J4520 Mild intermittent asthma, uncomplicated: Secondary | ICD-10-CM

## 2018-05-13 DIAGNOSIS — R053 Chronic cough: Secondary | ICD-10-CM

## 2018-05-13 DIAGNOSIS — J309 Allergic rhinitis, unspecified: Secondary | ICD-10-CM

## 2018-05-13 DIAGNOSIS — R05 Cough: Secondary | ICD-10-CM | POA: Diagnosis not present

## 2018-05-13 MED ORDER — MONTELUKAST SODIUM 10 MG PO TABS: 10.0000 mg | ORAL_TABLET | Freq: Every day | ORAL | 3 refills | Status: DC

## 2018-05-13 NOTE — Progress Notes (Signed)
ACUTE VISIT  HPI:  Chief Complaint  Patient presents with  . Sinusitis    cough that started 2 months ago    Lynn Hansen is a 42 y.o.female here today complaining of 2 days of respiratory symptoms. Non productive cough for 2 months. Exacerbated by changes in temperature. Occasional "little" wheezing. Alleviated by Albuterol inh,which she just started using.   Postnasal drainage,rhinorrhea,and nasal congestion. Intermittent dysphonia.   Cough  This is a recurrent problem. The current episode started more than 1 month ago. The problem has been waxing and waning. The problem occurs every few hours. The cough is non-productive. Associated symptoms include nasal congestion, postnasal drip, rhinorrhea and wheezing. Pertinent negatives include no chest pain, chills, ear congestion, ear pain, eye redness, fever, headaches, heartburn, hemoptysis, myalgias, rash, sore throat, shortness of breath, sweats or weight loss. The symptoms are aggravated by fumes and cold air. She has tried a beta-agonist inhaler for the symptoms. The treatment provided mild relief. Her past medical history is significant for asthma and environmental allergies.    She has had episodes of persistent cough before, 12/2016. No heartburn, she has Hx of GERD.   No Hx of recent travel. No sick contact. No known insect bite.  Hx of allergies: Yes,allergic rhinitis. She has reported Hx of asthma in a prior visit,today she is not certain. She was on Advair before. No tobacco use.   OTC medications for this problem: Zyrtec and "nasal spray."  Symptoms otherwise stable.  She is also concerned about possible "hernia", "hard spot" in abdomen she can palpate sometiems. She denies pain,nasuea,vomiting,or changes in bowel habits. S/P C-section.    Review of Systems  Constitutional: Negative for activity change, appetite change, chills, fatigue, fever and weight loss.  HENT: Positive for  congestion, postnasal drip, rhinorrhea and voice change. Negative for ear pain, mouth sores, sinus pressure, sneezing, sore throat and trouble swallowing.   Eyes: Positive for discharge and itching. Negative for redness.  Respiratory: Positive for cough and wheezing. Negative for hemoptysis and shortness of breath.   Cardiovascular: Negative for chest pain.  Gastrointestinal: Negative for abdominal pain, heartburn, nausea and vomiting.  Musculoskeletal: Negative for myalgias and neck pain.  Skin: Negative for rash.  Allergic/Immunologic: Positive for environmental allergies.  Neurological: Negative for weakness and headaches.  Hematological: Negative for adenopathy. Does not bruise/bleed easily.      Current Outpatient Medications on File Prior to Visit  Medication Sig Dispense Refill  . albuterol (PROVENTIL HFA;VENTOLIN HFA) 108 (90 Base) MCG/ACT inhaler Inhale 2 puffs into the lungs every 6 (six) hours as needed for wheezing or shortness of breath. 1 Inhaler 2  . cetirizine (ZYRTEC) 10 MG tablet Take 10 mg by mouth daily.    [name] CRANBERRY PO Take by mouth.    . fluticasone (FLONASE) 50 MCG/ACT nasal spray Place 2 sprays into both nostrils daily. 16 g 6  . Multiple Vitamins-Minerals (MULTIVITAMIN PO) Take by mouth.    . Probiotic Product (PROBIOTIC PO) Take by mouth.     No current facility-administered medications on file prior to visit.      Past Medical History:  Diagnosis Date  . Blood transfusion without reported diagnosis   . Cardiomegaly   . 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 Fibroid   . 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 HSV (herpes simplex  virus) infection   . Hypertension   . Migraines   . Sebaceous cyst of ear    right ear   No Known Allergies  Social History   Socioeconomic History  . Marital status: Single    Spouse name: Not  on file  . Number of children: 1  . Years of education: Not on file  . Highest education level: Not on file  Occupational History  . Not on file  Social Needs  . Financial resource strain: Not on file  . Food insecurity:    Worry: Not on file    Inability: Not on file  . Transportation needs:    Medical: Not on file    Non-medical: Not on file  Tobacco Use  . Smoking status: Never Smoker  . Smokeless tobacco: Never Used  Substance and Sexual Activity  . Alcohol use: Not Currently  . Drug use: No  . Sexual activity: Yes    Partners: Male    Birth control/protection: Condom  Lifestyle  . Physical activity:    Days per week: Not on file    Minutes per session: Not on file  . Stress: Not on file  Relationships  . Social connections:    Talks on phone: Not on file    Gets together: Not on file    Attends religious service: Not on file    Active member of club or organization: Not on file    Attends meetings of clubs or organizations: Not on file    Relationship status: Not on file  Other Topics Concern  . Not on file  Social History Narrative  . Not on file    Vitals:   05/13/18 1542  BP: 126/70  Pulse: 86  Resp: 12  Temp: 98.6 F (37 C)  SpO2: 98%   Body mass index is 42.6 kg/m.   Physical Exam  Nursing note and vitals reviewed. Constitutional: She is oriented to person, place, and time. She appears well-developed. She does not appear ill. No distress.  HENT:  Head: Normocephalic and atraumatic.  Right Ear: Tympanic membrane, external ear and ear canal normal.  Left Ear: Tympanic membrane, external ear and ear canal normal.  Nose: Right sinus exhibits no maxillary sinus tenderness and no frontal sinus tenderness. Left sinus exhibits no maxillary sinus tenderness and no frontal sinus tenderness.  Mouth/Throat: Oropharynx is clear and moist and mucous membranes are normal.  Hypertrophic turbinates. Postnasal drainage.  Eyes: Conjunctivae are normal.    Cardiovascular: Normal rate and regular rhythm.  No murmur heard. Respiratory: Effort normal and breath sounds normal. No respiratory distress.  Non productive cough a few times during visit.  GI: Soft. She exhibits no mass. There is no hepatomegaly. There is no tenderness.  Musculoskeletal: She exhibits no edema.  Lymphadenopathy:    She has no cervical adenopathy.  Neurological: She is alert and oriented to person, place, and time. She has normal strength.  Skin: Skin is warm. No rash noted. No erythema.  Psychiatric: She has a normal mood and affect. Her speech is normal.  Well groomed, good eye contact.    ASSESSMENT AND PLAN:   Lynn Hansen was seen today for sinusitis.  Diagnoses and all orders for this visit:  Persistent cough  Possible etiologies discussed,including GERD,allergies among some. CXR ordered.  -     DG Chest 2 View; Future -     Pulmonary Function Test; Future  Asthma, intermittent, uncomplicated  Albuterol inh 2 puff every 6 hours for  a week then as needed for wheezing or shortness of breath.  Singulair added. PFT will be arranged: Asthma vs COPD. F/U in 6 weeks.  -     DG Chest 2 View; Future -     Pulmonary Function Test; Future -     montelukast (SINGULAIR) 10 MG tablet; Take 1 tablet (10 mg total) by mouth at bedtime.  Allergic rhinitis, unspecified seasonality, unspecified trigger  Nasal irrigations. Continue Zyrtec 10 mg daily. Singulair 10 mg added. Saline irrigations with saline.  -     montelukast (SINGULAIR) 10 MG tablet; Take 1 tablet (10 mg total) by mouth at bedtime.    In regard to her concerned about hernia I do not appreciate clear mass or hernia defect. Options: Surgeon evaluation vs continue monitoring,sine she is not symptomatic she prefers the latter one.        Betty G. Martinique, MD  [facility]. [place] office.

## 2018-05-13 NOTE — Patient Instructions (Signed)
A few things to remember from today's visit:   Persistent cough - Plan: DG Chest 2 View, Pulmonary Function Test  Wheezing - Plan: DG Chest 2 View, Pulmonary Function Test Today X ray was ordered.  This can be done at [facility] at [facility] between 8 am and 5 pm: [place]. [phone].    Please be sure medication list is accurate. If a new problem present, please set up appointment sooner than planned today.

## 2018-05-14 ENCOUNTER — Ambulatory Visit (INDEPENDENT_AMBULATORY_CARE_PROVIDER_SITE_OTHER)
Admission: RE | Admit: 2018-05-14 | Discharge: 2018-05-14 | Disposition: A | Discharge status: Home / Self Care | Payer: BC Managed Care – PPO | Source: Ambulatory Visit | Attending: Family Medicine | Admitting: Family Medicine

## 2018-05-14 DIAGNOSIS — R062 Wheezing: Secondary | ICD-10-CM | POA: Diagnosis not present

## 2018-05-14 DIAGNOSIS — R05 Cough: Secondary | ICD-10-CM | POA: Diagnosis not present

## 2018-05-14 DIAGNOSIS — R053 Chronic cough: Secondary | ICD-10-CM

## 2018-05-19 ENCOUNTER — Ambulatory Visit (INDEPENDENT_AMBULATORY_CARE_PROVIDER_SITE_OTHER): Payer: BC Managed Care – PPO | Admitting: Internal Medicine

## 2018-05-19 DIAGNOSIS — J4520 Mild intermittent asthma, uncomplicated: Secondary | ICD-10-CM

## 2018-05-19 DIAGNOSIS — R05 Cough: Secondary | ICD-10-CM

## 2018-05-19 DIAGNOSIS — R053 Chronic cough: Secondary | ICD-10-CM

## 2018-05-19 LAB — PULMONARY FUNCTION TEST
FEF 25-75 POST: 2.44 L/s
FEF 25-75 Pre: 1.6 L/sec
FEF2575-%Change-Post: 52 %
FEF2575-%Pred-Post: 92 %
FEF2575-%Pred-Pre: 60 %
FEV1-%CHANGE-POST: 12 %
FEV1-%PRED-PRE: 75 %
FEV1-%Pred-Post: 84 %
FEV1-PRE: 1.76 L
FEV1-Post: 1.97 L
FEV1FVC-%CHANGE-POST: 10 %
FEV1FVC-%Pred-Pre: 93 %
FEV6-%Change-Post: 2 %
FEV6-%PRED-POST: 83 %
FEV6-%PRED-PRE: 81 %
FEV6-Post: 2.31 L
FEV6-Pre: 2.25 L
FEV6FVC-%CHANGE-POST: 0 %
FEV6FVC-%PRED-PRE: 101 %
FEV6FVC-%Pred-Post: 102 %
FVC-%CHANGE-POST: 1 %
FVC-%PRED-POST: 81 %
FVC-%Pred-Pre: 80 %
FVC-Post: 2.31 L
FVC-Pre: 2.27 L
POST FEV1/FVC RATIO: 86 %
Post FEV6/FVC ratio: 100 %
Pre FEV1/FVC ratio: 77 %
Pre FEV6/FVC Ratio: 99 %
RV % pred: 93 %
RV: 1.42 L
TLC % PRED: 81 %
TLC: 3.85 L

## 2018-05-19 NOTE — Progress Notes (Signed)
Patient was not able complete DLCO at all due to continue coughing during the test.   Patient was able to complete pre spiro, pleth and post spiro well.

## 2018-05-27 ENCOUNTER — Telehealth: Payer: Self-pay | Admitting: Family Medicine

## 2018-05-27 ENCOUNTER — Telehealth: Payer: Self-pay | Admitting: Internal Medicine

## 2018-05-27 NOTE — Telephone Encounter (Signed)
Spoke with pt. Advised her of Dr. Janee Morn response. She states that she contacted her PCP and they keep telling her that we are ones who need to give her the results. Pt is going to call her PCP one more time and try to get her results. Nothing further was needed at this time.

## 2018-05-27 NOTE — Telephone Encounter (Signed)
When the study is read, the results go back to the doctor who ordered the test. That's who she contacts for any report.

## 2018-05-27 NOTE — Telephone Encounter (Signed)
Copied from [place] [id number]. Topic: Quick Communication - Other Results >> May 27, 2018  1:35 PM Lennox Solders wrote: Pt is calling and per dr young note the patient must get the results from her PCP dr Martinique. Pt had PFT done on 05-19-18

## 2018-05-27 NOTE — Telephone Encounter (Signed)
Called pt and advised her that I would send a message to Dr. Annamaria Boots. Patient would like her results from the PFT but I do not see any results noted. CY please advise on results so we can let pt know.

## 2018-05-28 NOTE — Telephone Encounter (Signed)
Message sent to Dr. Jordan for review. 

## 2018-05-28 NOTE — Telephone Encounter (Signed)
PFTs already reviewed and recommendations given. Ean Gettel Martin[place], MD

## 2018-05-31 ENCOUNTER — Ambulatory Visit: Payer: BC Managed Care – PPO

## 2018-06-04 ENCOUNTER — Ambulatory Visit
Admission: RE | Admit: 2018-06-04 | Discharge: 2018-06-04 | Disposition: A | Discharge status: Home / Self Care | Payer: BC Managed Care – PPO | Source: Ambulatory Visit | Attending: Certified Nurse Midwife | Admitting: Certified Nurse Midwife

## 2018-06-04 DIAGNOSIS — Z1231 Encounter for screening mammogram for malignant neoplasm of breast: Secondary | ICD-10-CM

## 2018-06-25 ENCOUNTER — Ambulatory Visit: Payer: BC Managed Care – PPO | Admitting: Family Medicine

## 2018-06-30 ENCOUNTER — Ambulatory Visit: Payer: BC Managed Care – PPO | Admitting: Family Medicine

## 2018-06-30 ENCOUNTER — Encounter: Payer: Self-pay | Admitting: Family Medicine

## 2018-06-30 VITALS — BP 124/70 | HR 75 | Temp 98.4°F | Resp 12 | Ht 63.25 in | Wt 231.5 lb

## 2018-06-30 DIAGNOSIS — M549 Dorsalgia, unspecified: Secondary | ICD-10-CM | POA: Diagnosis not present

## 2018-06-30 MED ORDER — KETOROLAC TROMETHAMINE 60 MG/2ML IM SOLN
60.0000 mg | Freq: Once | INTRAMUSCULAR | Status: AC
  Administered 2018-06-30: 60 mg via INTRAMUSCULAR

## 2018-06-30 MED ORDER — CELECOXIB 100 MG PO CAPS: 100.0000 mg | ORAL_CAPSULE | Freq: Two times a day (BID) | ORAL | 0 refills | Status: AC

## 2018-06-30 MED ORDER — METHOCARBAMOL 500 MG PO TABS: 500.0000 mg | ORAL_TABLET | Freq: Three times a day (TID) | ORAL | 0 refills | Status: AC | PRN

## 2018-06-30 NOTE — Patient Instructions (Signed)
A few things to remember from today's visit:   Upper back pain on right side - Plan: celecoxib (CELEBREX) 100 MG capsule, methocarbamol (ROBAXIN) 500 MG tablet  In 8 hours you can restart Celebrex twice per day for up to 10 days. Remember muscle relaxant causes drowsiness. Local ice. Over-the-counter IcyHot or Aspercreme with lidocaine may help. Avoid shallow breathing. If not better in 1 to 2 weeks we could arrange physical therapy.  Please be sure medication list is accurate. If a new problem present, please set up appointment sooner than planned today.

## 2018-06-30 NOTE — Progress Notes (Signed)
ACUTE VISIT   HPI:  Chief Complaint  Patient presents with  . Back Pain    Upper between shoulder blades, started this morning    [name] is a 42 y.o. female, who is here today with her friend complaining of 2-3 days of upper back pain. Pain is constant achy and sometimes sharp (when she moves),no radiated, 4/10 at rest and "10/20" with breathing or movement.  Pain is exacerbated by breathing and moving, alleviated by rest. She has not taken OTC medication. No history of trauma. She has had pain on same area in the past but milder.  She denies dysphasia, odynophagia, wheezing, dyspnea, or chest pain. "A little bit" of cough. No local edema, erythema, or rash.  She has not taken pain medication.  Problem overall stable.  Review of Systems  Constitutional: Negative for appetite change, fatigue and fever.  HENT: Negative for mouth sores, sore throat, trouble swallowing and voice change.   Respiratory: Negative for cough, shortness of breath and wheezing.   Cardiovascular: Negative for chest pain, palpitations and leg swelling.  Gastrointestinal: Negative for nausea and vomiting.  Musculoskeletal: Positive for back pain. Negative for gait problem and neck pain.  Skin: Negative for rash.  Neurological: Negative for syncope, weakness, numbness and headaches.      Current Outpatient Medications on File Prior to Visit  Medication Sig Dispense Refill  . albuterol (PROVENTIL HFA;VENTOLIN HFA) 108 (90 Base) MCG/ACT inhaler Inhale 2 puffs into the lungs every 6 (six) hours as needed for wheezing or shortness of breath. 1 Inhaler 2  . CRANBERRY PO Take by mouth.    . fluticasone (FLONASE) 50 MCG/ACT nasal spray Place 2 sprays into both nostrils daily. 16 g 6  . montelukast (SINGULAIR) 10 MG tablet Take 1 tablet (10 mg total) by mouth at bedtime. 30 tablet 3  . Multiple Vitamins-Minerals (MULTIVITAMIN PO) Take by mouth.    . Probiotic Product (PROBIOTIC PO)  Take by mouth.     No current facility-administered medications on file prior to visit.      Past Medical History:  Diagnosis Date  . Blood transfusion without reported diagnosis   . Cardiomegaly   . 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 Fibroid   . 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 HSV (herpes simplex virus) infection   . Hypertension   . Migraines   . Sebaceous cyst of ear    right ear   No Known Allergies  Social History   Socioeconomic History  . Marital status: Single    Spouse name: Not on file  . Number of children: 1  . Years of education: Not on file  . Highest education level: Not on file  Occupational History  . Not on file  Social Needs  . Financial resource strain: Not on file  . Food insecurity:    Worry: Not on file    Inability: Not on file  . Transportation needs:    Medical: Not on file    Non-medical: Not on file  Tobacco Use  . Smoking status: Never Smoker  . Smokeless tobacco: Never Used  Substance and Sexual Activity  . Alcohol use: Not Currently  . Drug use: No  . Sexual activity: Yes    Partners: Male    Birth control/protection: Condom  Lifestyle  . Physical activity:  Days per week: Not on file    Minutes per session: Not on file  . Stress: Not on file  Relationships  . Social connections:    Talks on phone: Not on file    Gets together: Not on file    Attends religious service: Not on file    Active member of club or organization: Not on file    Attends meetings of clubs or organizations: Not on file    Relationship status: Not on file  Other Topics Concern  . Not on file  Social History Narrative  . Not on file    Vitals:   06/30/18 1516  BP: 124/70  Pulse: 75  Resp: 12  Temp: 98.4 F (36.9 C)  SpO2: 99%   Body mass index is 40.68 kg/m.   Physical Exam  Nursing note and  vitals reviewed. Constitutional: She is oriented to person, place, and time. She appears well-developed. She does not appear ill. She appears distressed.  HENT:  Head: Normocephalic and atraumatic.  Eyes: Pupils are equal, round, and reactive to light. Conjunctivae are normal.  Respiratory: Effort normal and breath sounds normal. No respiratory distress.  GI: Soft. She exhibits no mass. There is no hepatomegaly. There is no tenderness.  Musculoskeletal: She exhibits no edema.       Thoracic back: She exhibits tenderness. She exhibits no bony tenderness.       Back:  No significant deformity appreciated. Tenderness upon palpation of paraspinal right thoracic muscles. Pain elicited with movement and breathing.  No local edema or erythema appreciated, no suspicious lesions.  Lymphadenopathy:    She has no cervical adenopathy.  Neurological: She is alert and oriented to person, place, and time. She has normal strength. Gait normal.  Skin: Skin is warm. No rash noted. No erythema.  Psychiatric: Her mood appears anxious.  Well groomed, good eye contact.      ASSESSMENT AND PLAN:   Lynn Hansen was seen today for back pain.  Diagnoses and all orders for this visit:  Upper back pain on right side -     celecoxib (CELEBREX) 100 MG capsule; Take 1 capsule (100 mg total) by mouth 2 (two) times daily for 10 days. -     methocarbamol (ROBAXIN) 500 MG tablet; Take 1 tablet (500 mg total) by mouth every 8 (eight) hours as needed for up to 15 days for muscle spasms. -     ketorolac (TORADOL) injection 60 mg    After verbal consent and discussion of some side effects,she received Toradol 60 mg IM. Instructed to continue with Celebrex in 6-8 hours and bid for up to 10 days. Some side effects of muscle relaxants discussed. Avoid shallow breathing. I do not think imaging is needed today. Instructed about warning signs.    Return if symptoms worsen or fail to improve.     Rosario Kushner G.  Martinique, MD  [facility]. [place] office.

## 2018-07-08 ENCOUNTER — Encounter: Payer: Self-pay | Admitting: Family Medicine

## 2018-07-08 ENCOUNTER — Ambulatory Visit: Payer: BC Managed Care – PPO | Admitting: Family Medicine

## 2018-07-08 ENCOUNTER — Other Ambulatory Visit (HOSPITAL_COMMUNITY)
Admission: RE | Admit: 2018-07-08 | Discharge: 2018-07-08 | Disposition: A | Discharge status: Home / Self Care | Payer: BC Managed Care – PPO | Source: Ambulatory Visit | Attending: Family Medicine | Admitting: Family Medicine

## 2018-07-08 VITALS — BP 138/80 | HR 82 | Temp 98.3°F | Ht 63.25 in | Wt 234.2 lb

## 2018-07-08 DIAGNOSIS — N898 Other specified noninflammatory disorders of vagina: Secondary | ICD-10-CM | POA: Diagnosis present
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
## 2018-07-08 NOTE — Addendum Note (Signed)
Addended by: Agnes Lawrence on: 07/08/2018 01:20 PM   Modules accepted: Orders

## 2018-07-08 NOTE — Patient Instructions (Signed)
We have ordered labs or studies at this visit. It can take up to 1-2 weeks for results and processing. IF results require follow up or explanation, we will call you with instructions. Clinically stable results will be released to your [facility]. If you have not heard from [place] or cannot find your results in [facility] in 2 weeks please contact our office at [phone].  If you are not yet signed up for [facility], please consider signing up.  No douching or vaginal products lotions or gels.  I hope you are feeling better soon! Seek care promptly if your symptoms worsen, new concerns arise or you are not improving.

## 2018-07-09 LAB — CERVICOVAGINAL ANCILLARY ONLY
Bacterial vaginitis: POSITIVE — AB
CHLAMYDIA, DNA PROBE: NEGATIVE
Candida vaginitis: NEGATIVE
NEISSERIA GONORRHEA: NEGATIVE
Trichomonas: NEGATIVE

## 2018-07-09 MED ORDER — METRONIDAZOLE 500 MG PO TABS: 500.0000 mg | ORAL_TABLET | Freq: Two times a day (BID) | ORAL | 0 refills | Status: DC

## 2018-07-09 NOTE — Addendum Note (Signed)
Addended by: Agnes Lawrence on: 07/09/2018 04:34 PM   Modules accepted: Orders

## 2018-08-27 ENCOUNTER — Other Ambulatory Visit: Payer: Self-pay

## 2018-08-27 ENCOUNTER — Encounter: Payer: Self-pay | Admitting: Certified Nurse Midwife

## 2018-08-27 ENCOUNTER — Ambulatory Visit: Payer: BC Managed Care – PPO | Admitting: Certified Nurse Midwife

## 2018-08-27 VITALS — BP 140/82 | HR 72 | Resp 16 | Ht 63.25 in | Wt 231.0 lb

## 2018-08-27 DIAGNOSIS — N898 Other specified noninflammatory disorders of vagina: Secondary | ICD-10-CM | POA: Diagnosis not present

## 2018-08-27 DIAGNOSIS — Z113 Encounter for screening for infections with a predominantly sexual mode of transmission: Secondary | ICD-10-CM

## 2018-08-27 DIAGNOSIS — N926 Irregular menstruation, unspecified: Secondary | ICD-10-CM

## 2018-08-27 LAB — POCT URINE PREGNANCY: Preg Test, Ur: NEGATIVE

## 2018-08-27 NOTE — Patient Instructions (Signed)
Baking soda tub bath for vaginal discharge

## 2018-08-27 NOTE — Progress Notes (Signed)
42 y.o. Single African American female [phone] here with complaint of vaginal symptoms of  increase discharge. Describes discharge as white, no odor, egg white appearance. . Onset of symptoms 2 days ago. Denies new personal products except has been using Dr. Nancee Liter wash and Carlton Adam hair removal in vaginal area.  No STD concerns but would prefer testing today.. Urinary symptoms none . Contraception is condoms sometimes.  Review of Systems  Constitutional: Negative.   HENT: Negative.   Eyes: Negative.   Respiratory: Negative.   Cardiovascular: Negative.   Gastrointestinal: Negative.   Genitourinary:       Vaginal discharge  Musculoskeletal: Negative.   Skin: Negative.   Neurological: Negative.   Endo/Heme/Allergies: Negative.   Psychiatric/Behavioral: Negative.     O:Healthy female WDWN Affect: normal, orientation x 3  Exam:Skin : warm and dry Abdomen: non tender, soft  Inguinal Lymph nodes: no enlargement or tenderness Pelvic exam: External genital: normal female, no lesions BUS: negative Vagina: white normal appearing non odorous discharge noted. , Affirm taken Cervix: normal, non tender, no CMT Uterus: normal, non tender Adnexa:normal, non tender, no masses or fullness noted   A:Normal pelvic exam R/O vaginal infection STD screening   P:Discussed findings of normal pelvic exam and normal appearing vaginal discharge. Discussed  or baking soda sitz bath for comfort. Avoid moist clothes or pads for extended period of time, which will help with discharge. Will treat if indicated. Lab; Affirm Lab Gc/Chlamydia    Rv prn

## 2018-08-28 LAB — VAGINITIS/VAGINOSIS, DNA PROBE
Candida Species: NEGATIVE
GARDNERELLA VAGINALIS: POSITIVE — AB
TRICHOMONAS VAG: NEGATIVE

## 2018-08-29 LAB — GC/CHLAMYDIA PROBE AMP
CHLAMYDIA, DNA PROBE: NEGATIVE
NEISSERIA GONORRHOEAE BY PCR: NEGATIVE

## 2018-08-31 ENCOUNTER — Ambulatory Visit: Payer: BC Managed Care – PPO | Admitting: Family Medicine

## 2018-08-31 ENCOUNTER — Other Ambulatory Visit: Payer: Self-pay

## 2018-08-31 ENCOUNTER — Encounter: Payer: Self-pay | Admitting: Family Medicine

## 2018-08-31 VITALS — BP 124/82 | HR 75 | Temp 98.4°F | Resp 12 | Ht 63.25 in | Wt 233.1 lb

## 2018-08-31 DIAGNOSIS — K432 Incisional hernia without obstruction or gangrene: Secondary | ICD-10-CM

## 2018-08-31 DIAGNOSIS — R739 Hyperglycemia, unspecified: Secondary | ICD-10-CM | POA: Diagnosis not present

## 2018-08-31 DIAGNOSIS — R1010 Upper abdominal pain, unspecified: Secondary | ICD-10-CM | POA: Diagnosis not present

## 2018-08-31 MED ORDER — METRONIDAZOLE 500 MG PO TABS: 500.0000 mg | ORAL_TABLET | Freq: Two times a day (BID) | ORAL | 0 refills | Status: DC

## 2018-08-31 NOTE — Progress Notes (Signed)
ACUTE VISIT   HPI:  Chief Complaint  Patient presents with  . Follow-up    stomach hernia    [name] is a 42 y.o. female, who is here today complaining of "stomach hernia." I saw her on 05/13/2018 for persistent cough, at that time she mentioned a "hard spot" that she could palpate sometimes in her upper abdomen.  She has had problem for years but it seems to be getting worse, she thinks she might have a hernia.  She reporting upper abdominal surgery when she was an infant. No fever, chills, changes in bowel habits, nausea, vomiting, or blood in the stool. Pain is intermittent no radiated, 5/10, "tender", it can be worse if the presses area.  She has not identified exacerbating or alleviating factors.  Glucose elevated in 02/2018, 114. No history of diabetes. She has not been consistent with a healthy diet or regular physical activity.  Lab Results  Component Value Date   HGBA1C 5.8 (H) 01/08/2017     Review of Systems  Constitutional: Negative for activity change, appetite change, fatigue and fever.  HENT: Negative for mouth sores, sore throat and trouble swallowing.   Respiratory: Negative for cough, shortness of breath and wheezing.   Gastrointestinal: Positive for abdominal distention and abdominal pain. Negative for blood in stool, nausea and vomiting.  Endocrine: Negative for polydipsia, polyphagia and polyuria.  Genitourinary: Negative for decreased urine volume and hematuria.  Musculoskeletal: Negative for back pain and myalgias.      Current Outpatient Medications on File Prior to Visit  Medication Sig Dispense Refill  . albuterol (PROVENTIL HFA;VENTOLIN HFA) 108 (90 Base) MCG/ACT inhaler Inhale 2 puffs into the lungs every 6 (six) hours as needed for wheezing or shortness of breath. 1 Inhaler 2  . CRANBERRY PO Take by mouth.    . fluticasone (FLONASE) 50 MCG/ACT nasal spray Place 2 sprays into both nostrils daily. 16 g 6  . montelukast  (SINGULAIR) 10 MG tablet Take 1 tablet (10 mg total) by mouth at bedtime. 30 tablet 3  . Multiple Vitamins-Minerals (MULTIVITAMIN PO) Take by mouth.    . Probiotic Product (PROBIOTIC PO) Take by mouth.     No current facility-administered medications on file prior to visit.      Past Medical History:  Diagnosis Date  . Asthma   . Blood transfusion without reported diagnosis   . Cardiomegaly   . 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 Fibroid   . 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 HSV (herpes simplex virus) infection   . Hypertension   . Migraines   . Sebaceous cyst of ear    right ear   No Known Allergies  Social History   Socioeconomic History  . Marital status: Single    Spouse name: Not on file  . Number of children: 1  . Years of education: Not on file  . Highest education level: Not on file  Occupational History  . Not on file  Social Needs  . Financial resource strain: Not on file  . Food insecurity:    Worry: Not on file    Inability: Not on file  . Transportation needs:    Medical: Not on file    Non-medical: Not on file  Tobacco Use  . Smoking status: Never Smoker  . Smokeless tobacco: Never Used  Substance  and Sexual Activity  . Alcohol use: Not Currently  . Drug use: No  . Sexual activity: Yes    Partners: Male    Birth control/protection: Condom    Comment: condoms occ  Lifestyle  . Physical activity:    Days per week: Not on file    Minutes per session: Not on file  . Stress: Not on file  Relationships  . Social connections:    Talks on phone: Not on file    Gets together: Not on file    Attends religious service: Not on file    Active member of club or organization: Not on file    Attends meetings of clubs or organizations: Not on file    Relationship status: Not on file  Other Topics Concern  . Not on file    Social History Narrative  . Not on file    Vitals:   08/31/18 1510  BP: 124/82  Pulse: 75  Resp: 12  Temp: 98.4 F (36.9 C)  SpO2: 99%   Body mass index is 40.97 kg/m.  Wt Readings from Last 3 Encounters:  08/31/18 233 lb 2 oz (105.7 kg)  08/27/18 231 lb (104.8 kg)  07/08/18 234 lb 3.2 oz (106.2 kg)     Physical Exam  Nursing note and vitals reviewed. Constitutional: She is oriented to person, place, and time. She appears well-developed. She does not appear ill. No distress.  HENT:  Head: Normocephalic and atraumatic.  Mouth/Throat: Oropharynx is clear and moist and mucous membranes are normal.  Eyes: Conjunctivae are normal. No scleral icterus.  Cardiovascular: Normal rate and regular rhythm.  No murmur heard. Respiratory: Effort normal and breath sounds normal. No respiratory distress.  GI: Soft. Bowel sounds are normal. She exhibits no distension and no mass. There is no hepatomegaly. There is tenderness in the periumbilical area.    Upon valsalva maneuver I feel a small nodular lesion,not able to reduce and tender when I try to do so. ? Hernia.    Musculoskeletal: She exhibits no edema.  Lymphadenopathy:    She has no cervical adenopathy.  Neurological: She is alert and oriented to person, place, and time. She has normal strength.  Skin: Skin is warm. No rash noted. No erythema.  Psychiatric: She has a normal mood and affect.  Well groomed, good eye contact.      ASSESSMENT AND PLAN:   [name] was seen today for follow-up.  Diagnoses and all orders for this visit:  Upper abdominal pain  Possible etiology discussed. Because she has had problem for over 6 months, I think abdominal imaging is appropriate. Instructed about warning signs. Further recommendations will be given according to imaging results.  -     CT Abdomen Pelvis W Contrast; Future -     Basic metabolic panel  Incisional hernia, without obstruction or gangrene  Vs rectus  diastasis. Instructed about warning signs. We will hold on referral to surgeon until we have CT report.  -     CT Abdomen Pelvis W Contrast; Future  Hyperglycemia  Healthy lifestyle for primary prevention of DM 2. Further recommendations will be given according to A1c results.  -     Hemoglobin A1c  Morbid (severe) obesity due to excess calories (HCC) We discussed benefits of wt loss as well as adverse effects of obesity. HgA1C already slightly elevated. This problem can also exacerbate her asthma.  Consistency with healthy diet and physical activity recommended.      Return if symptoms  worsen or fail to improve.       Jahel Wavra G. [place], MD  [facility] [place] office.

## 2018-08-31 NOTE — Assessment & Plan Note (Addendum)
We discussed benefits of wt loss as well as adverse effects of obesity. HgA1C already slightly elevated. This problem can also exacerbate her asthma.  Consistency with healthy diet and physical activity recommended.

## 2018-08-31 NOTE — Patient Instructions (Signed)
A few things to remember from today's visit:   Incisional hernia, without obstruction or gangrene - Plan: CT Abdomen Pelvis W Contrast  Abdominal distension (gaseous) - Plan: CT Abdomen Pelvis W Contrast   Diastasis Recti Diastasis recti is when the muscles of the abdomen (rectus abdominis muscles) become thin and separate. The result is a wider space between the right and left abdomen (abdominal) muscles. This wider space between the muscles may cause a bulge in the middle of your abdomen. You may notice this bulge when you are straining or when you sit up from a lying down position. Diastasis recti can affect men and women. It is most common among pregnant women, infants, people who are obese, and people who have had abdominal surgery. Exercise or surgical treatment may help correct it. What are the causes? Common causes of this condition include:  Pregnancy. The growing uterus puts pressure on the abdominal muscles, which causes the muscles to separate.  Obesity. Excess fat puts pressure on abdominal muscles.  Weightlifting.  Some abdomen exercises.  Advanced age.  Genetics.  Prior abdominal surgery.  What increases the risk? This condition is more likely to develop in:  Women.  Newborns, especially newborns who are born early (prematurely).  What are the signs or symptoms? Common symptoms of this condition include:  A bulge in the middle of the abdomen. You will notice it most when you sit up or strain.  Pain in the low back, pelvis, or hips.  Constipation.  Inability to control when you urinate (urinary incontinence).  Bloating.  Poor posture.  How is this diagnosed? This condition is diagnosed with a physical exam. Your health care provider will ask you to lie flat on your back and do a crunch or half sit-up. If you have diastasis recti, a vertical bulge will appear between your abdominal muscles in the center of your abdomen. Your health care provider will  measure the gap between your muscles with one of the following:  A medical device used to measure the space between two objects (caliper).  A tape measure.  CT scan.  Ultrasound.  Finger spaces. Your health care provider will measure the space using their fingers.  How is this treated? If your muscle separation is not too large, you may not need treatment. However, if you are a woman who plans to become pregnant again, you should treat this condition before your next pregnancy. Treatment may include:  Physical therapy to strengthen and tighten your abdominal muscles.  Lifestyle changes such as weight loss and exercise.  Over-the-counter pain medicines as needed.  Surgery to correct the separation.  Follow these instructions at home: Activity  Return to your normal activities as told by your health care provider. Ask your health care provider what activities are safe for you.  When lifting weights or doing exercises using your abdominal muscles or the muscles in the center of your body that give stability (core muscles), make sure you are doing your exercises and movements correctly. Proper form can help to prevent the condition from happening again. General instructions  If you are overweight, ask your health care provider for help with weight loss. Losing even a small amount of weight can help to improve your diastasis recti.  Take over-the-counter or prescription medicines only as told by your health care provider.  Do not strain. Straining can make the separation worse. Examples of straining include: ? Pushing hard to have a bowel movement, such as due to constipation. ? Lifting  heavy objects, including children. ? Standing up and sitting down.  Take steps to prevent constipation: ? Drink enough fluid to keep your urine clear or pale yellow. ? Take over-the-counter or prescription medicines only as directed. ? Eat foods that are high in fiber, such as fresh fruits and  vegetables, whole grains, and beans. ? Limit foods that are high in fat and processed sugars, such as fried and sweet foods. Contact a health care provider if:  You notice a new bulge in your abdomen. Get help right away if:  You experience severe discomfort in your abdomen.  You develop severe abdominal pain along with nausea, vomiting, or fever. Summary  Diastasis recti is when the abdomen (abdominal) muscles become thin and separate. Your abdomen will stick out because the space between your right and left abdomen muscles has widened.  The most common symptom is a bulge in your abdomen. You will notice it most when you sit up or are straining.  This condition is diagnosed during a physical exam.  If the abdomen separation is not too big, you may choose not to have treatment. Otherwise, you may need to undergo physical therapy or surgery. This information is not intended to replace advice given to you by your health care provider. Make sure you discuss any questions you have with your health care provider. Document Released: 01/26/2017 Document Revised: 01/26/2017 Document Reviewed: 01/26/2017 Elsevier Interactive Patient Education  [employer].  Please be sure medication list is accurate. If a new problem present, please set up appointment sooner than planned today.

## 2018-09-01 LAB — BASIC METABOLIC PANEL
BUN: 11 mg/dL (ref 6–23)
CHLORIDE: 103 meq/L (ref 96–112)
CO2: 29 mEq/L (ref 19–32)
CREATININE: 0.63 mg/dL (ref 0.40–1.20)
Calcium: 9.3 mg/dL (ref 8.4–10.5)
GFR: 133.36 mL/min (ref >60.00)
Glucose, Bld: 84 mg/dL (ref 70–99)
Potassium: 3.9 mEq/L (ref 3.5–5.1)
Sodium: 139 mEq/L (ref 135–145)

## 2018-09-01 LAB — HEMOGLOBIN A1C: HEMOGLOBIN A1C: 6 % (ref 4.6–6.5)

## 2018-09-14 ENCOUNTER — Inpatient Hospital Stay: Admission: RE | Admit: 2018-09-14 | Payer: BC Managed Care – PPO | Source: Ambulatory Visit

## 2018-09-16 ENCOUNTER — Ambulatory Visit (INDEPENDENT_AMBULATORY_CARE_PROVIDER_SITE_OTHER)
Admission: RE | Admit: 2018-09-16 | Discharge: 2018-09-16 | Disposition: A | Discharge status: Home / Self Care | Payer: BC Managed Care – PPO | Source: Ambulatory Visit | Attending: Family Medicine | Admitting: Family Medicine

## 2018-09-16 DIAGNOSIS — R1010 Upper abdominal pain, unspecified: Secondary | ICD-10-CM

## 2018-09-16 DIAGNOSIS — K432 Incisional hernia without obstruction or gangrene: Secondary | ICD-10-CM

## 2018-09-16 MED ORDER — IOPAMIDOL (ISOVUE-300) INJECTION 61%
100.0000 mL | Freq: Once | INTRAVENOUS | Status: AC | PRN
  Administered 2018-09-16: 100 mL via INTRAVENOUS

## 2018-09-22 ENCOUNTER — Other Ambulatory Visit: Payer: Self-pay | Admitting: Family Medicine

## 2018-09-22 DIAGNOSIS — J309 Allergic rhinitis, unspecified: Secondary | ICD-10-CM

## 2018-09-22 DIAGNOSIS — J4520 Mild intermittent asthma, uncomplicated: Secondary | ICD-10-CM

## 2018-10-24 ENCOUNTER — Other Ambulatory Visit: Payer: Self-pay | Admitting: Family Medicine

## 2018-10-24 DIAGNOSIS — J4520 Mild intermittent asthma, uncomplicated: Secondary | ICD-10-CM

## 2018-11-09 ENCOUNTER — Telehealth: Payer: Self-pay | Admitting: Family Medicine

## 2018-11-09 NOTE — Telephone Encounter (Signed)
error 

## 2018-11-09 NOTE — Telephone Encounter (Signed)
Pt dismissal letter in guarantor snapshot

## 2019-01-07 ENCOUNTER — Ambulatory Visit: Payer: 59 | Admitting: Certified Nurse Midwife

## 2019-01-07 ENCOUNTER — Other Ambulatory Visit: Payer: Self-pay

## 2019-01-07 ENCOUNTER — Encounter: Payer: Self-pay | Admitting: Certified Nurse Midwife

## 2019-01-07 VITALS — BP 122/70 | HR 64 | Resp 16 | Wt 230.0 lb

## 2019-01-07 DIAGNOSIS — Z01419 Encounter for gynecological examination (general) (routine) without abnormal findings: Secondary | ICD-10-CM

## 2019-01-07 DIAGNOSIS — N898 Other specified noninflammatory disorders of vagina: Secondary | ICD-10-CM | POA: Diagnosis not present

## 2019-01-07 NOTE — Progress Notes (Signed)
43 y.o. Single African American female [id number] here with complaint of vaginal symptoms of  increase discharge. Describes discharge as large amount of white discharge with odor.Denies itching or burning. Onset of symptoms 3 days ago. Denies new personal products or vaginal dryness.. Urinary symptoms none . Contraception is Condoms.No period changes. No change in partners or STD screening needed. No other health issues today.  Review of Systems  Constitutional: Negative.   HENT: Negative.   Eyes: Negative.   Respiratory: Negative.   Cardiovascular: Negative.   Gastrointestinal: Negative.   Genitourinary:       Vaginal discharge with odor,no itching  Musculoskeletal: Negative.   Skin: Negative.   Neurological: Negative.   Endo/Heme/Allergies: Negative.   Psychiatric/Behavioral: Negative.     O:Healthy female WDWN Affect: normal, orientation x 3  Exam:: Skin warm and dry Abdomen:soft, non tender no masses  inguinal Lymph nodes: no enlargement or tenderness Pelvic exam: External genital: normal female no lesions, no redness or exudate or scaling BUS: negative Vagina: white slightly watery odorous discharge noted.    , Affirm taken Cervix: normal, non tender, no CMT Uterus: normal, non tender Adnexa:normal, non tender, no masses or fullness noted  A:Normal pelvic exam Vaginal discharge with odor R/O vaginal infection   P:Discussed findings of vaginal discharge/odor and possible etiology. Discussed Aveeno or baking soda sitz bath for comfort. Avoid moist clothes extended period of time. If working out in gym clothes for long periods of time change underwear if possible. Coconut Oil can be of use for skin protection prior to activity or can be used to external skin for protection or dryness. Questions addressed. Lab: Affirm will treat as indicated.  Rv prn

## 2019-01-07 NOTE — Patient Instructions (Signed)
Vaginitis    Vaginitis is irritation and swelling (inflammation) of the vagina. It happens when normal bacteria and yeast in the vagina grow too much. There are many types of this condition. Treatment will depend on the type you have.  Follow these instructions at home:  Lifestyle  · Keep your vagina area clean and dry.  ? Avoid using soap.  ? Rinse the area with water.  · Do not do the following until your doctor says it is okay:  ? Wash and clean out the vagina (douche).  ? Use tampons.  ? Have sex.  · Wipe from front to back after going to the bathroom.  · Let air reach your vagina.  ? Wear cotton underwear.  ? Do not wear:  ? Underwear while you sleep.  ? Tight pants.  ? Thong underwear.  ? Underwear or nylons without a cotton panel.  ? Take off any wet clothing, such as bathing suits, as soon as possible.  · Use gentle, non-scented products. Do not use things that can irritate the vagina, such as fabric softeners. Avoid the following products if they are scented:  ? Feminine sprays.  ? Detergents.  ? Tampons.  ? Feminine hygiene products.  ? Soaps or bubble baths.  · Practice safe sex and use condoms.  General instructions  · Take over-the-counter and prescription medicines only as told by your doctor.  · If you were prescribed an antibiotic medicine, take or use it as told by your doctor. Do not stop taking or using the antibiotic even if you start to feel better.  · Keep all follow-up visits as told by your doctor. This is important.  Contact a doctor if:  · You have pain in your belly.  · You have a fever.  · Your symptoms last for more than 2-3 days.  Get help right away if:  · You have a fever and your symptoms get worse all of a sudden.  Summary  · Vaginitis is irritation and swelling of the vagina. It can happen when the normal bacteria and yeast in the vagina grow too much. There are many types.  · Treatment will depend on the type you have.  · Do not douche, use tampons , or have sex until your health  care provider approves. When you can return to sex, practice safe sex and use condoms.  This information is not intended to replace advice given to you by your health care provider. Make sure you discuss any questions you have with your health care provider.  Document Released: 02/27/2009 Document Revised: 12/23/2016 Document Reviewed: 12/23/2016  Elsevier Interactive Patient Education © 2019 Elsevier Inc.

## 2019-01-08 LAB — VAGINITIS/VAGINOSIS, DNA PROBE
Candida Species: NEGATIVE
GARDNERELLA VAGINALIS: POSITIVE — AB
Trichomonas vaginosis: NEGATIVE

## 2019-01-10 ENCOUNTER — Telehealth: Payer: Self-pay | Admitting: *Deleted

## 2019-01-10 NOTE — Telephone Encounter (Signed)
-----   Message from Regina Eck, CNM sent at 01/09/2019  1:10 PM EST ----- Notify patient her vaginal screen was positive for BV only. She needs Rx Flagyl 500 mg bid x 7 days or Metrogel twice daily for 7 days ,her choice. Will take up two weeks for normal vaginal ph balance after treatment. Discharge does increase during treatment which is normal

## 2019-01-10 NOTE — Telephone Encounter (Signed)
Notes recorded by Burnice Logan, RN on 01/10/2019 at 12:44 PM EST Left message to call Sharee Pimple, RN at [facility].   ------

## 2019-01-11 MED ORDER — METRONIDAZOLE 500 MG PO TABS: 500.0000 mg | ORAL_TABLET | Freq: Two times a day (BID) | ORAL | 0 refills | Status: DC

## 2019-01-11 NOTE — Telephone Encounter (Signed)
Spoke with patient, advised as seen below per Melvia Heaps, CNM. Rx for flagyl PO to verified pharmacy. ETOH precautions reviewed.  Patient verbalizes understanding and is agreeable.   Encounter closed.

## 2019-02-21 ENCOUNTER — Telehealth: Payer: Self-pay | Admitting: Certified Nurse Midwife

## 2019-02-21 ENCOUNTER — Ambulatory Visit: Payer: Self-pay | Admitting: Obstetrics & Gynecology

## 2019-02-21 ENCOUNTER — Telehealth: Payer: Self-pay | Admitting: Obstetrics & Gynecology

## 2019-02-21 NOTE — Telephone Encounter (Signed)
Patient has a discharge. Asking triage to assist with scheduling.

## 2019-02-21 NOTE — Telephone Encounter (Signed)
Spoke with patient. Reports vaginal d/c that started on 3/6 following use of Aveeno oatmeal bath on 3/5. Denies any other GYN Symptoms. Tx for BV on 01/07/19. Requesting OV today with any available provider. OV scheduled for today at 1545 with Dr. Sabra Heck.  Routing to provider for final review. Patient is agreeable to disposition. Will close encounter.  Cc: Melvia Heaps, CNM

## 2019-02-21 NOTE — Telephone Encounter (Signed)
Patient called and rescheduled her appointment for vaginal discharge today with Dr. Sabra Heck to Friday, 02/25/19, at 2:20 PM. She reported her menstrual cycle started but should be done by this Friday.   Routing to provider for review and to close encounter.

## 2019-02-25 ENCOUNTER — Ambulatory Visit: Payer: 59 | Admitting: Obstetrics & Gynecology

## 2019-02-25 ENCOUNTER — Other Ambulatory Visit: Payer: Self-pay

## 2019-02-25 ENCOUNTER — Encounter: Payer: Self-pay | Admitting: Obstetrics & Gynecology

## 2019-02-25 VITALS — BP 138/80 | HR 84 | Temp 98.2°F | Resp 16 | Ht 63.25 in | Wt 226.0 lb

## 2019-02-25 DIAGNOSIS — N898 Other specified noninflammatory disorders of vagina: Secondary | ICD-10-CM

## 2019-02-25 DIAGNOSIS — N766 Ulceration of vulva: Secondary | ICD-10-CM | POA: Diagnosis not present

## 2019-02-25 MED ORDER — NONFORMULARY OR COMPOUNDED ITEM: 0 refills | Status: DC

## 2019-02-25 MED ORDER — METRONIDAZOLE 500 MG PO TABS: 500.0000 mg | ORAL_TABLET | Freq: Two times a day (BID) | ORAL | 0 refills | Status: DC
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
## 2019-03-01 LAB — NUSWAB BV AND CANDIDA, NAA
Atopobium vaginae: HIGH Score — AB
BVAB 2: HIGH {score} — AB
CANDIDA ALBICANS, NAA: NEGATIVE
Candida glabrata, NAA: NEGATIVE
MEGASPHAERA 1: HIGH {score} — AB

## 2019-03-01 LAB — HERPES SIMPLEX VIRUS(HSV) DNA BY PCR
HSV 2 DNA: NEGATIVE
HSV-1 DNA: NEGATIVE

## 2019-03-07 ENCOUNTER — Other Ambulatory Visit: Payer: Self-pay | Admitting: Obstetrics & Gynecology

## 2019-03-07 DIAGNOSIS — B373 Candidiasis of vulva and vagina: Secondary | ICD-10-CM

## 2019-03-07 DIAGNOSIS — B3731 Acute candidiasis of vulva and vagina: Secondary | ICD-10-CM

## 2019-03-07 NOTE — Telephone Encounter (Signed)
Patient sent request for refill of Diflucan from one year ago.   Recently treated by Dr. Sabra Heck for bacterial vaginosis and starting on suppression. Has completed oral Flagyl.   Sunday, started with vaginal itching, and thick white discharge. Feels this is yeast. Does not want office visit.   Advised will sent to Dr. Talbert Nan to review and advise.

## 2019-03-07 NOTE — Telephone Encounter (Signed)
Call to patient.  Instructions discussed.  Encounter closed.

## 2019-03-07 NOTE — Telephone Encounter (Signed)
Script for diflucan sent

## 2019-03-07 NOTE — Telephone Encounter (Signed)
Routing to triage

## 2019-03-08 ENCOUNTER — Ambulatory Visit: Payer: BC Managed Care – PPO | Admitting: Certified Nurse Midwife
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
## 2019-05-23 ENCOUNTER — Ambulatory Visit (INDEPENDENT_AMBULATORY_CARE_PROVIDER_SITE_OTHER): Payer: 59 | Admitting: Certified Nurse Midwife

## 2019-05-23 ENCOUNTER — Other Ambulatory Visit (HOSPITAL_COMMUNITY)
Admission: RE | Admit: 2019-05-23 | Discharge: 2019-05-23 | Disposition: A | Discharge status: Home / Self Care | Payer: 59 | Source: Ambulatory Visit | Attending: Certified Nurse Midwife | Admitting: Certified Nurse Midwife

## 2019-05-23 ENCOUNTER — Other Ambulatory Visit: Payer: Self-pay

## 2019-05-23 ENCOUNTER — Encounter: Payer: Self-pay | Admitting: Certified Nurse Midwife

## 2019-05-23 VITALS — BP 112/70 | HR 68 | Temp 97.6°F | Resp 16 | Ht 62.75 in | Wt 219.0 lb

## 2019-05-23 DIAGNOSIS — Z124 Encounter for screening for malignant neoplasm of cervix: Secondary | ICD-10-CM | POA: Diagnosis not present

## 2019-05-23 DIAGNOSIS — N852 Hypertrophy of uterus: Secondary | ICD-10-CM

## 2019-05-23 DIAGNOSIS — Z01419 Encounter for gynecological examination (general) (routine) without abnormal findings: Secondary | ICD-10-CM | POA: Diagnosis not present

## 2019-05-23 DIAGNOSIS — Z8742 Personal history of other diseases of the female genital tract: Secondary | ICD-10-CM | POA: Diagnosis not present

## 2019-05-23 DIAGNOSIS — Z86018 Personal history of other benign neoplasm: Secondary | ICD-10-CM

## 2019-05-27 LAB — CYTOLOGY - PAP
Adequacy: ABSENT
Diagnosis: NEGATIVE
HPV: NOT DETECTED

## 2019-05-30 ENCOUNTER — Encounter: Payer: 59 | Admitting: Family Medicine

## 2019-05-30 ENCOUNTER — Other Ambulatory Visit: Payer: Self-pay
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
## 2019-06-01 ENCOUNTER — Other Ambulatory Visit: Payer: Self-pay

## 2019-06-01 ENCOUNTER — Ambulatory Visit: Payer: 59 | Admitting: Family Medicine

## 2019-06-01 ENCOUNTER — Telehealth: Payer: Self-pay

## 2019-06-01 NOTE — Telephone Encounter (Signed)
Patient was to have a Doxy visit with Dr. Martinique at 9:30. Both an email and text message were sent to the patient. I have also reached out to the patient via phone and had to leave a message.  When the patient calls back she will need to reschedule her appointment.  CRM created.

## 2019-06-03 ENCOUNTER — Ambulatory Visit: Payer: 59 | Admitting: Obstetrics & Gynecology

## 2019-06-13 ENCOUNTER — Telehealth: Payer: Self-pay | Admitting: Family Medicine

## 2019-06-13 NOTE — Telephone Encounter (Signed)
The patient had FMLA paperwork faxed  Fax forms to: [phone]   Disposition: Dr's Folder

## 2019-06-13 NOTE — Telephone Encounter (Signed)
Form given to provider's desk for completion.

## 2019-06-14 ENCOUNTER — Other Ambulatory Visit: Payer: Self-pay

## 2019-06-14 ENCOUNTER — Ambulatory Visit (INDEPENDENT_AMBULATORY_CARE_PROVIDER_SITE_OTHER): Payer: 59 | Admitting: Family Medicine

## 2019-06-14 DIAGNOSIS — J309 Allergic rhinitis, unspecified: Secondary | ICD-10-CM | POA: Diagnosis not present

## 2019-06-14 DIAGNOSIS — F419 Anxiety disorder, unspecified: Secondary | ICD-10-CM

## 2019-06-14 DIAGNOSIS — J4520 Mild intermittent asthma, uncomplicated: Secondary | ICD-10-CM | POA: Diagnosis not present

## 2019-06-14 MED ORDER — SERTRALINE HCL 50 MG PO TABS: 50.0000 mg | ORAL_TABLET | Freq: Every day | ORAL | 1 refills | Status: DC

## 2019-06-14 MED ORDER — ALBUTEROL SULFATE HFA 108 (90 BASE) MCG/ACT IN AERS: INHALATION_SPRAY | RESPIRATORY_TRACT | 2 refills | Status: DC

## 2019-06-14 MED ORDER — FLOVENT HFA 110 MCG/ACT IN AERO: 2.0000 | INHALATION_SPRAY | Freq: Two times a day (BID) | RESPIRATORY_TRACT | 3 refills | Status: DC
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
## 2019-06-15 ENCOUNTER — Encounter: Payer: Self-pay | Admitting: Family Medicine

## 2019-06-15 DIAGNOSIS — Z0279 Encounter for issue of other medical certificate: Secondary | ICD-10-CM

## 2019-06-15 MED ORDER — MONTELUKAST SODIUM 10 MG PO TABS: ORAL_TABLET | ORAL | 2 refills | Status: DC

## 2019-06-16 ENCOUNTER — Telehealth: Payer: Self-pay | Admitting: Family Medicine

## 2019-06-16 NOTE — Telephone Encounter (Signed)
The patient had FMLA forms faxed to our office  Fax the forms to: [phone]  Disposition: Dr's Folder

## 2019-06-20 ENCOUNTER — Other Ambulatory Visit: Payer: Self-pay

## 2019-06-20 ENCOUNTER — Ambulatory Visit (INDEPENDENT_AMBULATORY_CARE_PROVIDER_SITE_OTHER): Payer: 59 | Admitting: Family Medicine

## 2019-06-20 ENCOUNTER — Ambulatory Visit: Payer: Self-pay

## 2019-06-20 ENCOUNTER — Encounter: Payer: Self-pay | Admitting: Family Medicine

## 2019-06-20 DIAGNOSIS — S29011A Strain of muscle and tendon of front wall of thorax, initial encounter: Secondary | ICD-10-CM | POA: Diagnosis not present

## 2019-06-20 NOTE — Telephone Encounter (Signed)
Patient informed of $29 charge. Forms faxed.

## 2019-06-20 NOTE — Telephone Encounter (Signed)
Incoming call from Patient with complaint of right sided chest pain. Reports that it is constant , rated moderate.  . Onset was last night.  Does not radiate .  Denies cardiac issues and lung issues Reviewed protocol and provided care advice.    Reason for Disposition . Chest pain(s) lasting a few seconds  Answer Assessment - Initial Assessment Questions 1. LOCATION: "Where does it hurt?"       Right side Pulled muscles 2. RADIATION: "Does the pain go anywhere else?" (e.g., into neck, jaw, arms, back)     denies 3. ONSET: "When did the chest pain begin?" (Minutes, hours or days)      Last night 4. PATTERN "Does the pain come and go, or has it been constant since it started?"  "Does it get worse with exertion?"      constant 5. DURATION: "How long does it last" (e.g., seconds, minutes, hours)     *No Answer* 6. SEVERITY: "How bad is the pain?"  (e.g., Scale 1-10; mild, moderate, or severe)    - MILD (1-3): doesn't interfere with normal activities     - MODERATE (4-7): interferes with normal activities or awakens from sleep    - SEVERE (8-10): excruciating pain, unable to do any normal activities      moderate 7. CARDIAC RISK FACTORS: "Do you have any history of heart problems or risk factors for heart disease?" (e.g., prior heart attack, angina; high blood pressure, diabetes, being overweight, high cholesterol, smoking, or strong family history of heart disease)     denies 8. PULMONARY RISK FACTORS: "Do you have any history of lung disease?"  (e.g., blood clots in lung, asthma, emphysema, birth control pills)    asthma denies SOb 9. CAUSE: "What do you think is causing the chest pain?"     Pulled muscle 10. OTHER SYMPTOMS: "Do you have any other symptoms?" (e.g., dizziness, nausea, vomiting, sweating, fever, difficulty breathing, cough)      denies 11. PREGNANCY: "Is there any chance you are pregnant?" "When was your last menstrual period?"      Just went off  Protocols used: CHEST  PAIN-A-AH

## 2019-06-20 NOTE — Progress Notes (Signed)
   Subjective:    Patient ID: Lynn Hansen, female    DOB: 01-29-1976, 43 y.o.   MRN: [id number]  HPI Virtual Visit via Telephone Note  I connected with the patient on 06/20/19 at  2:15 PM EDT by telephone and verified that I am speaking with the correct person using two identifiers. We attempted to connect virtually but we had technical difficulties with the audio and video.     I discussed the limitations, risks, security and privacy concerns of performing an evaluation and management service by telephone and the availability of in person appointments. I also discussed with the patient that there may be a patient responsible charge related to this service. The patient expressed understanding and agreed to proceed.  Location patient: home Location provider: work or home office Participants present for the call: patient, provider Patient did not have a visit in the prior 7 days to address this/these issue(s).   History of Present Illness: Here for muscle soreness in the right upper chest area for several days. She did a lot of heavy house cleaning over the weekend, and that night the soreness began. It hurts if she moves her right arm above her head. No cough or SOB. Ibuprofen helped the pain quite a bit.   Observations/Objective: Patient sounds cheerful and well on the phone. I do not appreciate any SOB. Speech and thought processing are grossly intact. Patient reported vitals:  Assessment and Plan: Muscular strain. She will rest and apply ice to the area. Use Ibuprofen as needed.  Alysia Penna, MD   Follow Up Instructions:     [phone] [phone] 11-20 [id number] 21-30 I did not refer this patient for an OV in the next 24 hours for this/these issue(s).  I discussed the assessment and treatment plan with the patient. The patient was provided an opportunity to ask questions and all were answered. The patient agreed with the plan and demonstrated an understanding of the  instructions.   The patient was advised to call back or seek an in-person evaluation if the symptoms worsen or if the condition fails to improve as anticipated.  I provided 12 minutes of non-face-to-face time during this encounter.   Alysia Penna, MD   Review of Systems     Objective:   Physical Exam        Assessment & Plan:

## 2019-06-20 NOTE — Telephone Encounter (Signed)
Patient has an appointment with Dr. Sarajane Jews today at 2:15 PM

## 2019-07-01 ENCOUNTER — Encounter: Payer: 59 | Admitting: Family Medicine

## 2019-07-13 ENCOUNTER — Encounter: Payer: 59 | Admitting: Family Medicine

## 2019-07-26 ENCOUNTER — Encounter: Payer: Self-pay | Admitting: Family Medicine

## 2019-07-26 ENCOUNTER — Ambulatory Visit (INDEPENDENT_AMBULATORY_CARE_PROVIDER_SITE_OTHER): Payer: 59 | Admitting: Family Medicine

## 2019-07-26 ENCOUNTER — Other Ambulatory Visit: Payer: Self-pay

## 2019-07-26 VITALS — BP 130/80 | HR 75 | Temp 98.6°F | Resp 12 | Ht 62.75 in | Wt 217.2 lb

## 2019-07-26 DIAGNOSIS — Z130 Encounter for screening for diseases of the blood and blood-forming organs and certain disorders involving the immune mechanism: Secondary | ICD-10-CM | POA: Diagnosis not present

## 2019-07-26 DIAGNOSIS — Z0000 Encounter for general adult medical examination without abnormal findings: Secondary | ICD-10-CM

## 2019-07-26 DIAGNOSIS — Z13228 Encounter for screening for other metabolic disorders: Secondary | ICD-10-CM

## 2019-07-26 DIAGNOSIS — F419 Anxiety disorder, unspecified: Secondary | ICD-10-CM

## 2019-07-26 DIAGNOSIS — Z13220 Encounter for screening for lipoid disorders: Secondary | ICD-10-CM | POA: Diagnosis not present

## 2019-07-26 DIAGNOSIS — I83813 Varicose veins of bilateral lower extremities with pain: Secondary | ICD-10-CM | POA: Diagnosis not present

## 2019-07-26 DIAGNOSIS — Z1329 Encounter for screening for other suspected endocrine disorder: Secondary | ICD-10-CM | POA: Diagnosis not present

## 2019-07-26 LAB — LIPID PANEL
Cholesterol: 205 mg/dL — ABNORMAL HIGH (ref 0–200)
HDL: 65 mg/dL (ref >39.00)
LDL Cholesterol: 128 mg/dL — ABNORMAL HIGH (ref 0–99)
NonHDL: 140.17
Total CHOL/HDL Ratio: 3
Triglycerides: 62 mg/dL (ref 0.0–149.0)
VLDL: 12.4 mg/dL (ref 0.0–40.0)

## 2019-07-26 LAB — HEMOGLOBIN A1C: Hgb A1c MFr Bld: 5.5 % (ref 4.6–6.5)

## 2019-07-26 NOTE — Patient Instructions (Signed)
A few things to remember from today's visit:   Routine general medical examination at a health care facility  Screening for lipoid disorders - Plan: Lipid panel  Screening for endocrine, metabolic and immunity disorder - Plan: Hemoglobin A1c  Varicose veins of bilateral lower extremities with pain - Plan: Ambulatory referral to Vascular Surgery Today you have you routine preventive visit.  At least 150 minutes of moderate exercise per week, daily brisk walking for 15-30 min is a good exercise option. Healthy diet low in saturated (animal) fats and sweets and consisting of fresh fruits and vegetables, lean meats such as fish and white chicken and whole grains.  These are some of recommendations for screening depending of age and risk factors:   - Vaccines:  Tdap vaccine every 10 years.  Shingles vaccine recommended at age 20, could be given after 43 years of age but not sure about insurance coverage.   Pneumonia vaccines:  Prevnar 13 at 65 and Pneumovax at 46. Sometimes Pneumovax is giving earlier if history of smoking, lung disease,diabetes,kidney disease among some.    Screening for diabetes at age 58 and every 3 years.  Cervical cancer prevention:  Pap smear starts at 43 years of age and continues periodically until 43 years old in low risk women. Pap smear every 3 years between 64 and 60 years old. Pap smear every 3-5 years between women 57 and older if pap smear negative and HPV screening negative.   -Breast cancer: Mammogram: There is disagreement between experts about when to start screening in low risk asymptomatic female but recent recommendations are to start screening at 75 and not later than 43 years old , every 1-2 years and after 42 yo q 2 years. Screening is recommended until 43 years old but some women can continue screening depending of healthy issues.   Colon cancer screening: starts at 43 years old until 43 years old.  Cholesterol disorder screening at age  101 and every 3 years.  Also recommended:  1. Dental visit- Brush and floss your teeth twice daily; visit your dentist twice a year. 2. Eye doctor- Get an eye exam at least every 2 years. 3. Helmet use- Always wear a helmet when riding a bicycle, motorcycle, rollerblading or skateboarding. 4. Safe sex- If you may be exposed to sexually transmitted infections, use a condom. 5. Seat belts- Seat belts can save your live; always wear one. 6. Smoke/Carbon Monoxide detectors- These detectors need to be installed on the appropriate level of your home. Replace batteries at least once a year. 7. Skin cancer- When out in the sun please cover up and use sunscreen 15 SPF or higher. 8. Violence- If anyone is threatening or hurting you, please tell your healthcare provider.  9. Drink alcohol in moderation- Limit alcohol intake to one drink or less per day. Never drink and drive.   Please be sure medication list is accurate. If a new problem present, please set up appointment sooner than planned today.
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
## 2019-07-31 ENCOUNTER — Encounter: Payer: Self-pay | Admitting: Family Medicine

## 2019-09-06 ENCOUNTER — Encounter: Payer: Self-pay | Admitting: Gynecology

## 2019-09-09 ENCOUNTER — Ambulatory Visit (HOSPITAL_COMMUNITY): Payer: 59

## 2019-09-22 ENCOUNTER — Other Ambulatory Visit: Payer: Self-pay

## 2019-09-22 DIAGNOSIS — I83893 Varicose veins of bilateral lower extremities with other complications: Secondary | ICD-10-CM

## 2019-09-27 ENCOUNTER — Inpatient Hospital Stay (HOSPITAL_COMMUNITY): Admission: RE | Admit: 2019-09-27 | Payer: 59 | Source: Ambulatory Visit

## 2019-10-21 ENCOUNTER — Telehealth: Payer: Self-pay | Admitting: Certified Nurse Midwife

## 2019-10-21 ENCOUNTER — Other Ambulatory Visit: Payer: Self-pay | Admitting: Certified Nurse Midwife

## 2019-10-21 MED ORDER — NONFORMULARY OR COMPOUNDED ITEM: 2 refills | Status: DC

## 2019-10-21 NOTE — Telephone Encounter (Signed)
rx faxed to [facility] 

## 2019-10-21 NOTE — Telephone Encounter (Signed)
Patient would like to know if she is eligible for a refill on boric acid suppositories.

## 2019-10-21 NOTE — Telephone Encounter (Signed)
Spoke with pt. Pt requests med refill. Pt states has been working well and had no problems.  Please send to [facility] at [facility] at [address].   Med refill request: Boric Acid Suppositories Last AEX: 05/23/19 Next AEX:05/23/20 Last MMG (if hormonal med) 06/04/2018 Refill authorized: Please Advise?  Will route to Debbi, CNM.

## 2019-10-24 NOTE — Telephone Encounter (Signed)
Spoke with pt. Pt updated on Rx at [facility]. Routing to provider for final review. Patient is agreeable to disposition. Will close encounter.

## 2019-12-14 ENCOUNTER — Encounter: Payer: Self-pay | Admitting: Family Medicine

## 2019-12-14 ENCOUNTER — Other Ambulatory Visit: Payer: Self-pay

## 2019-12-14 ENCOUNTER — Ambulatory Visit (INDEPENDENT_AMBULATORY_CARE_PROVIDER_SITE_OTHER): Payer: BC Managed Care – PPO | Admitting: Family Medicine

## 2019-12-14 ENCOUNTER — Ambulatory Visit (INDEPENDENT_AMBULATORY_CARE_PROVIDER_SITE_OTHER): Payer: BC Managed Care – PPO

## 2019-12-14 VITALS — BP 130/80 | HR 97 | Resp 12 | Ht 62.75 in | Wt 220.0 lb

## 2019-12-14 DIAGNOSIS — J309 Allergic rhinitis, unspecified: Secondary | ICD-10-CM

## 2019-12-14 DIAGNOSIS — R053 Chronic cough: Secondary | ICD-10-CM

## 2019-12-14 DIAGNOSIS — J4520 Mild intermittent asthma, uncomplicated: Secondary | ICD-10-CM

## 2019-12-14 DIAGNOSIS — R05 Cough: Secondary | ICD-10-CM

## 2019-12-14 DIAGNOSIS — L02413 Cutaneous abscess of right upper limb: Secondary | ICD-10-CM | POA: Diagnosis not present

## 2019-12-14 MED ORDER — FLOVENT HFA 110 MCG/ACT IN AERO: 2.0000 | INHALATION_SPRAY | Freq: Two times a day (BID) | RESPIRATORY_TRACT | 3 refills | Status: DC

## 2019-12-14 MED ORDER — SULFAMETHOXAZOLE-TRIMETHOPRIM 800-160 MG PO TABS: 1.0000 | ORAL_TABLET | Freq: Two times a day (BID) | ORAL | 0 refills | Status: AC

## 2019-12-14 MED ORDER — OMEPRAZOLE 20 MG PO CPDR: 20.0000 mg | DELAYED_RELEASE_CAPSULE | Freq: Two times a day (BID) | ORAL | 2 refills | Status: DC

## 2019-12-14 MED ORDER — AEROCHAMBER PLUS MISC: 1 refills | Status: DC

## 2019-12-14 NOTE — Patient Instructions (Signed)
A few things to remember from today's visit:   Allergic rhinitis, unspecified seasonality, unspecified trigger  Persistent cough - Plan: DG Chest 2 View  Abscess of right shoulder - Plan: sulfamethoxazole-trimethoprim (BACTRIM DS) 800-160 MG tablet  Asthma, intermittent, uncomplicated - Plan: Spacer/Aero-Holding Chambers (AEROCHAMBER PLUS) inhaler, fluticasone (FLOVENT HFA) 110 MCG/ACT inhaler  Skin Abscess  A skin abscess is an infected area on or under your skin that contains a collection of pus and other material. An abscess may also be called a furuncle, carbuncle, or boil. An abscess can occur in or on almost any part of your body. Some abscesses break open (rupture) on their own. Most continue to get worse unless they are treated. The infection can spread deeper into the body and eventually into your blood, which can make you feel ill. Treatment usually involves draining the abscess. What are the causes? An abscess occurs when germs, like bacteria, pass through your skin and cause an infection. This may be caused by:  A scrape or cut on your skin.  A puncture wound through your skin, including a needle injection or insect bite.  Blocked oil or sweat glands.  Blocked and infected hair follicles.  A cyst that forms beneath your skin (sebaceous cyst) and becomes infected. What increases the risk? This condition is more likely to develop in people who:  Have a weak body defense system (immune system).  Have diabetes.  Have dry and irritated skin.  Get frequent injections or use illegal IV drugs.  Have a foreign body in a wound, such as a splinter.  Have problems with their lymph system or veins. What are the signs or symptoms? Symptoms of this condition include:  A painful, firm bump under the skin.  A bump with pus at the top. This may break through the skin and drain. Other symptoms include:  Redness surrounding the abscess site.  Warmth.  Swelling of the  lymph nodes (glands) near the abscess.  Tenderness.  A sore on the skin. How is this diagnosed? This condition may be diagnosed based on:  A physical exam.  Your medical history.  A sample of pus. This may be used to find out what is causing the infection.  Blood tests.  Imaging tests, such as an ultrasound, CT scan, or MRI. How is this treated? A small abscess that drains on its own may not need treatment. Treatment for larger abscesses may include:  Moist heat or heat pack applied to the area several times a day.  A procedure to drain the abscess (incision and drainage).  Antibiotic medicines. For a severe abscess, you may first get antibiotics through an IV and then change to antibiotics by mouth. Follow these instructions at home: Medicines   Take over-the-counter and prescription medicines only as told by your health care provider.  If you were prescribed an antibiotic medicine, take it as told by your health care provider. Do not stop taking the antibiotic even if you start to feel better. Abscess care   If you have an abscess that has not drained, apply heat to the affected area. Use the heat source that your health care provider recommends, such as a moist heat pack or a heating pad. ? Place a towel between your skin and the heat source. ? Leave the heat on for 20-30 minutes. ? Remove the heat if your skin turns bright red. This is especially important if you are unable to feel pain, heat, or cold. You may have a greater  risk of getting burned.  Follow instructions from your health care provider about how to take care of your abscess. Make sure you: ? Cover the abscess with a bandage (dressing). ? Change your dressing or gauze as told by your health care provider. ? Wash your hands with soap and water before you change the dressing or gauze. If soap and water are not available, use hand sanitizer.  Check your abscess every day for signs of a worsening infection.  Check for: ? More redness, swelling, or pain. ? More fluid or blood. ? Warmth. ? More pus or a bad smell. General instructions  To avoid spreading the infection: ? Do not share personal care items, towels, or hot tubs with others. ? Avoid making skin contact with other people.  Keep all follow-up visits as told by your health care provider. This is important. Contact a health care provider if you have:  More redness, swelling, or pain around your abscess.  More fluid or blood coming from your abscess.  Warm skin around your abscess.  More pus or a bad smell coming from your abscess.  A fever.  Muscle aches.  Chills or a general ill feeling. Get help right away if you:  Have severe pain.  See red streaks on your skin spreading away from the abscess. Summary  A skin abscess is an infected area on or under your skin that contains a collection of pus and other material.  A small abscess that drains on its own may not need treatment.  Treatment for larger abscesses may include having a procedure to drain the abscess and taking an antibiotic. This information is not intended to replace advice given to you by your health care provider. Make sure you discuss any questions you have with your health care provider. Document Released: 09/10/2005 Document Revised: 03/24/2019 Document Reviewed: 01/14/2018 Elsevier Patient Education  [place].   Please be sure medication list is accurate. If a new problem present, please set up appointment sooner than planned today.

## 2019-12-14 NOTE — Progress Notes (Signed)
ACUTE VISIT   HPI:  Chief Complaint  Patient presents with  . boil on shoulder    [name] is a 43 y.o. female, who is here today complaining of tender lesion posterior aspect of right shoulder. Gradual onset.  Noted small tender lesion 3 days ago,she thought is was a "pimple." Lesion has increased in size and more tender,getting worse. Soreness , constant,7/10. Exacerbated by rubbing with clothes or palpation.  She has not tried OTC treatments. No prior Hx. Negative for fever,chills,body aches,or unusual fatigue.   She is also complaining about persistent cough, he has been worse for the past 2 months. He seems to be worse at night when lying down. She has not noted heartburn. Associated wheezing,"sometiems." No sick contact,chnages in smell or taste.  Problem is not worse with exertion but rather with sudden temp changes. No associated N/V or abdominal pain.  Asthma, she is using Flovent 110 mcg as needed and albuterol inhaler 2 puffs every 6 hours as needed. Negative for chest pain or SOB. She is on Singulair 10 mg and Flonase nasal spray prn for allergic rhinitis.  Review of Systems  Constitutional: Negative for activity change and appetite change.  HENT: Negative for ear pain, mouth sores, sinus pressure, sore throat and trouble swallowing.   Eyes: Negative for discharge and redness.  Respiratory: Negative for chest tightness and stridor.   Cardiovascular: Negative for palpitations and leg swelling.  Gastrointestinal: Negative for abdominal pain.       No changes in bowel movement.  Skin: Negative for rash.  Allergic/Immunologic: Positive for environmental allergies.  Neurological: Negative for syncope, weakness and headaches.  Hematological: Negative for adenopathy. Does not bruise/bleed easily.  Rest see pertinent positives and negatives per HPI.   Current Outpatient Medications on File Prior to Visit  Medication Sig Dispense Refill    . albuterol (VENTOLIN HFA) 108 (90 Base) MCG/ACT inhaler TAKE 2 PUFFS BY MOUTH EVERY 6 HOURS AS NEEDED FOR WHEEZE OR SHORTNESS OF BREATH 18 g 2  . clonazePAM (KLONOPIN) 0.5 MG tablet TAKE 1 TABLET BY MOUTH AT BEDTIME AS NEEDED FOR ANXIETY/INSOMNIA    . CRANBERRY PO Take by mouth.    . fluticasone (FLONASE) 50 MCG/ACT nasal spray Place 2 sprays into both nostrils daily. 16 g 6  . montelukast (SINGULAIR) 10 MG tablet TAKE 1 TABLET BY MOUTH EVERYDAY AT BEDTIME 90 tablet 2  . Multiple Vitamins-Minerals (MULTIVITAMIN PO) Take by mouth.    . NONFORMULARY OR COMPOUNDED ITEM Boric acid 600mg  vaginal suppositories.  One PV nightly x 30 nights only when symptoms reoccur 30 each 2  . sertraline (ZOLOFT) 50 MG tablet Take 1 tablet (50 mg total) by mouth daily. Start 1/2tab x 5-7 days then increase to whole tab. 30 tablet 1   No current facility-administered medications on file prior to visit.     Past Medical History:  Diagnosis Date  . Asthma   . Blood transfusion without reported diagnosis   . Cardiomegaly   . 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 Fibroid   . 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 HSV (herpes simplex virus) infection   . Hypertension   . Migraines   . Sebaceous cyst of ear    right ear   No Known Allergies  Social History   Socioeconomic History  . Marital status: Single  Spouse name: Not on file  . Number of children: 1  . Years of education: Not on file  . Highest education level: Not on file  Occupational History  . Not on file  Tobacco Use  . Smoking status: Never Smoker  . Smokeless tobacco: Never Used  Substance and Sexual Activity  . Alcohol use: Not Currently  . Drug use: No  . Sexual activity: Yes    Partners: Male    Birth control/protection: Condom    Comment: condoms occ  Other Topics Concern  . Not on file  Social  History Narrative  . Not on file   Social Determinants of Health   Financial Resource Strain:   . Difficulty of Paying Living Expenses: Not on file  Food Insecurity:   . Worried About Charity fundraiser in the Last Year: Not on file  . Ran Out of Food in the Last Year: Not on file  Transportation Needs:   . Lack of Transportation (Medical): Not on file  . Lack of Transportation (Non-Medical): Not on file  Physical Activity:   . Days of Exercise per Week: Not on file  . Minutes of Exercise per Session: Not on file  Stress:   . Feeling of Stress : Not on file  Social Connections:   . Frequency of Communication with Friends and Family: Not on file  . Frequency of Social Gatherings with Friends and Family: Not on file  . Attends Religious Services: Not on file  . Active Member of Clubs or Organizations: Not on file  . Attends Archivist Meetings: Not on file  . Marital Status: Not on file    Vitals:   12/14/19 1434  BP: 130/80  Pulse: 97  Resp: 12  SpO2: 97%   Body mass index is 39.28 kg/m.   Physical Exam  Nursing note and vitals reviewed. Constitutional: She is oriented to person, place, and time. She appears well-developed. No distress.  HENT:  Head: Normocephalic and atraumatic.  Mouth/Throat: Oropharynx is clear and moist and mucous membranes are normal.  Hypertrophic turbinates.  Eyes: Conjunctivae are normal.  Cardiovascular: Normal rate and regular rhythm.  No murmur heard. Respiratory: Effort normal and breath sounds normal. No respiratory distress.  No cough during visit.  Musculoskeletal:        General: No edema.  Lymphadenopathy:    She has no cervical adenopathy.  Neurological: She is alert and oriented to person, place, and time. She has normal strength. No cranial nerve deficit. Gait normal.  Skin: Skin is warm. Lesion noted. No rash noted. There is erythema.     About 4 cm nodular lesion with erythematous center,2-3 cm). There is no  fluctuant area. Central black dimple appreciated. See picture.  Psychiatric: She has a normal mood and affect.  Well groomed, good eye contact.        ASSESSMENT AND PLAN:  [name] was seen today for boil on shoulder.  Diagnoses and all orders for this visit: Orders Placed This Encounter  Procedures  . DG Chest 2 View    Abscess of right shoulder Lesion is indurated,not ready fir I&D. ? Underlying sebaceus cyst. Local heat/warm compresses. Bactrim DS x 7 days.  -     sulfamethoxazole-trimethoprim (BACTRIM DS) 800-160 MG tablet; Take 1 tablet by mouth 2 (two) times daily for 7 days.  Asthma, intermittent, uncomplicated This problem may be contributing to persistent cough. Instructed to use Flovent bid. Continue Albuterol in 2 puff q 4-6 hours  prn. Spacer will help with effectiveness of medication.  -     Spacer/Aero-Holding Chambers (AEROCHAMBER PLUS) inhaler; Use as instructed to use with inahaler. -     fluticasone (FLOVENT HFA) 110 MCG/ACT inhaler; Inhale 2 puffs into the lungs 2 (two) times daily.  Allergic rhinitis, unspecified seasonality, unspecified trigger Nasal irrigations with saline as needed. Continue Flonase nasal spray and Singulair 10 mg.  Persistent cough Possible etiologies discussed: ?asthma,allergies,and GERD among some. She agrees with Omeprazole trial. Further recommendations according to CXR results.  -     omeprazole (PRILOSEC) 20 MG capsule; Take 1 capsule (20 mg total) by mouth 2 (two) times daily before a meal.    Return in about 2 months (around 02/12/2020) for cough and asthma.    Anglia Blakley G. Martinique, MD  [facility]. [place] office.

## 2020-01-04 ENCOUNTER — Other Ambulatory Visit: Payer: Self-pay | Admitting: Family Medicine

## 2020-01-04 DIAGNOSIS — J4520 Mild intermittent asthma, uncomplicated: Secondary | ICD-10-CM

## 2020-01-25 ENCOUNTER — Telehealth: Payer: Self-pay | Admitting: Certified Nurse Midwife

## 2020-01-25 NOTE — Telephone Encounter (Signed)
Patient is having issues with her cycle.

## 2020-01-25 NOTE — Telephone Encounter (Signed)
Spoke with patient. Patient reports LMP 01/15/20, spotting almost daily since. Denies any other GYN symptoms. No contraceptive. Patient is SA. Has not taken UPT.   Advised patient to take UPT to r/o pregnancy. OV scheduled for 01/30/20 at 4pm. Patient declined earlier OV. Advised patient tpo return call to office if UPT positive. Patient verbalizes understanding and is agreeable.   Routing to provider for final review. Patient is agreeable to disposition. Will close encounter.

## 2020-01-25 NOTE — Telephone Encounter (Signed)
Call to patient, no answer, no voicemail.   Last AEX 05/23/19

## 2020-01-27 ENCOUNTER — Other Ambulatory Visit: Payer: Self-pay

## 2020-01-30 ENCOUNTER — Ambulatory Visit: Payer: BC Managed Care – PPO | Admitting: Certified Nurse Midwife

## 2020-03-05 ENCOUNTER — Ambulatory Visit (INDEPENDENT_AMBULATORY_CARE_PROVIDER_SITE_OTHER): Payer: BC Managed Care – PPO | Admitting: Certified Nurse Midwife

## 2020-03-05 ENCOUNTER — Other Ambulatory Visit: Payer: Self-pay

## 2020-03-05 ENCOUNTER — Encounter: Payer: Self-pay | Admitting: Certified Nurse Midwife

## 2020-03-05 ENCOUNTER — Other Ambulatory Visit (HOSPITAL_COMMUNITY)
Admission: RE | Admit: 2020-03-05 | Discharge: 2020-03-05 | Disposition: A | Discharge status: Home / Self Care | Payer: BC Managed Care – PPO | Source: Ambulatory Visit | Attending: Certified Nurse Midwife | Admitting: Certified Nurse Midwife

## 2020-03-05 VITALS — BP 122/82 | HR 70 | Temp 98.2°F | Resp 16

## 2020-03-05 DIAGNOSIS — Z01419 Encounter for gynecological examination (general) (routine) without abnormal findings: Secondary | ICD-10-CM | POA: Diagnosis not present

## 2020-03-05 DIAGNOSIS — Z86018 Personal history of other benign neoplasm: Secondary | ICD-10-CM

## 2020-03-05 DIAGNOSIS — N938 Other specified abnormal uterine and vaginal bleeding: Secondary | ICD-10-CM

## 2020-03-05 DIAGNOSIS — N852 Hypertrophy of uterus: Secondary | ICD-10-CM | POA: Diagnosis not present

## 2020-03-05 DIAGNOSIS — Z124 Encounter for screening for malignant neoplasm of cervix: Secondary | ICD-10-CM

## 2020-03-05 NOTE — Patient Instructions (Signed)
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
## 2020-03-06 ENCOUNTER — Telehealth: Payer: Self-pay | Admitting: Obstetrics & Gynecology

## 2020-03-06 ENCOUNTER — Telehealth: Payer: Self-pay | Admitting: *Deleted

## 2020-03-06 DIAGNOSIS — Z86018 Personal history of other benign neoplasm: Secondary | ICD-10-CM

## 2020-03-06 DIAGNOSIS — N938 Other specified abnormal uterine and vaginal bleeding: Secondary | ICD-10-CM

## 2020-03-06 DIAGNOSIS — N852 Hypertrophy of uterus: Secondary | ICD-10-CM

## 2020-03-06 LAB — CYTOLOGY - PAP: Diagnosis: REACTIVE

## 2020-03-06 NOTE — Telephone Encounter (Signed)
-----   Message from Regina Eck, CNM sent at 03/05/2020  9:49 PM EDT ----- Patient needs PUS scheduled to evaluate fibroids due to cycle change with DUB and increase uterine size. She is aware she will be called and scheduled

## 2020-03-06 NOTE — Telephone Encounter (Signed)
Call placed to convey benefits for ultrasound. °

## 2020-03-06 NOTE — Telephone Encounter (Signed)
Spoke with patient. Patient request to schedule with Dr. Sabra Heck.  PUS scheduled for 3/25 at 12:30pm, consult to follow with Dr. Sabra Heck. Order placed for precert. Patient verbalizes understanding and is agreeable.   Routing to provider for final review. Patient is agreeable to disposition. Will close encounter.  Cc: Dr. Sabra Heck, [facility] [place]

## 2020-03-07 ENCOUNTER — Other Ambulatory Visit: Payer: Self-pay

## 2020-03-07 ENCOUNTER — Encounter: Payer: Self-pay | Admitting: Certified Nurse Midwife

## 2020-03-08 ENCOUNTER — Ambulatory Visit (INDEPENDENT_AMBULATORY_CARE_PROVIDER_SITE_OTHER): Payer: BC Managed Care – PPO

## 2020-03-08 ENCOUNTER — Ambulatory Visit: Payer: BC Managed Care – PPO | Admitting: Obstetrics & Gynecology

## 2020-03-08 ENCOUNTER — Encounter: Payer: Self-pay | Admitting: Obstetrics & Gynecology

## 2020-03-08 VITALS — BP 126/70 | HR 76 | Temp 97.9°F | Ht 62.5 in | Wt 222.2 lb

## 2020-03-08 DIAGNOSIS — D251 Intramural leiomyoma of uterus: Secondary | ICD-10-CM

## 2020-03-08 DIAGNOSIS — Z86018 Personal history of other benign neoplasm: Secondary | ICD-10-CM | POA: Diagnosis not present

## 2020-03-08 DIAGNOSIS — N852 Hypertrophy of uterus: Secondary | ICD-10-CM

## 2020-03-08 DIAGNOSIS — N938 Other specified abnormal uterine and vaginal bleeding: Secondary | ICD-10-CM

## 2020-03-08 NOTE — Progress Notes (Signed)
44 y.o. [id number] Single Black or African American female here for pelvic ultrasound due to change in size of uterus on physical with Melvia Heaps, CNM, on 03/05/2020.  Pt's cycles are regular and last about 4 days.  The first day is very heavy and she typically changes products every 1 to keep from having an accident but, otherwise, feels cycles are management.  She does have some bloating around the time of her cycle as well but this resolves after cycle ends  Patient's last menstrual period was 02/23/2020 (exact date).  Contraception: condoms, most of the time  Findings:  UTERUS: 12.1 x 8.0 x 5.3cm with at least sever fibroids, largest measuring 4.6 x 3.3cm.  She has another 4.2cm fibroid and a 3.5cm fibroid EMS:4.46mm ADNEXA: Left ovary: 2.2 x 2.2 x 1.5cm       Right ovary:  2.1 x 1.5 x 1.0cm CUL DE SAC: no free fluid  Discussion:  Images reviewed with pt and findings discsused.  Pt aware uterus is larger and she likely has more fibroids present than last pelvic imaging which was with CT in 09/2018.  Treatment options including [place], Depo Lupron, progesterone methods, uterine artery embolization, myomectomy, hysterectomy all discussed.  Pt understands this is a benign process and treatment only needed to control symptoms.  She does feel her bleeding is manageable and does not want any treatment at this time.  Knows to call if symptoms changes.  Assessment:  Enlarged fibroid uterus  Plan:  Pt will follow up for AEX and call if she has any changes in symptoms. Message sent to Dr. Martinique to see if she would order CBC and iron studies with routine exam labs tomorrow when pt has appt.  20 minutes spent in face to face discussion after images were reviewed and results discussed.  This time was spent answering questions, discussing typical fibroids symptoms as well as treatment options.

## 2020-03-09 ENCOUNTER — Other Ambulatory Visit: Payer: Self-pay | Admitting: Family Medicine

## 2020-03-09 ENCOUNTER — Encounter: Payer: BC Managed Care – PPO | Admitting: Family Medicine

## 2020-03-09 DIAGNOSIS — Z0289 Encounter for other administrative examinations: Secondary | ICD-10-CM

## 2020-03-28 ENCOUNTER — Other Ambulatory Visit: Payer: Self-pay | Admitting: Certified Nurse Midwife

## 2020-03-28 DIAGNOSIS — Z1231 Encounter for screening mammogram for malignant neoplasm of breast: Secondary | ICD-10-CM

## 2020-04-03 ENCOUNTER — Ambulatory Visit: Payer: BC Managed Care – PPO

## 2020-04-23 ENCOUNTER — Ambulatory Visit: Payer: BC Managed Care – PPO

## 2020-05-02 ENCOUNTER — Encounter (INDEPENDENT_AMBULATORY_CARE_PROVIDER_SITE_OTHER): Payer: Self-pay | Admitting: Bariatrics

## 2020-05-02 ENCOUNTER — Other Ambulatory Visit: Payer: Self-pay

## 2020-05-02 ENCOUNTER — Ambulatory Visit (INDEPENDENT_AMBULATORY_CARE_PROVIDER_SITE_OTHER): Payer: BC Managed Care – PPO | Admitting: Bariatrics

## 2020-05-02 VITALS — BP 144/81 | HR 78 | Temp 98.8°F | Ht 63.0 in | Wt 214.0 lb

## 2020-05-02 DIAGNOSIS — R0602 Shortness of breath: Secondary | ICD-10-CM

## 2020-05-02 DIAGNOSIS — Z1331 Encounter for screening for depression: Secondary | ICD-10-CM | POA: Diagnosis not present

## 2020-05-02 DIAGNOSIS — I10 Essential (primary) hypertension: Secondary | ICD-10-CM

## 2020-05-02 DIAGNOSIS — Z6838 Body mass index (BMI) 38.0-38.9, adult: Secondary | ICD-10-CM

## 2020-05-02 DIAGNOSIS — R5383 Other fatigue: Secondary | ICD-10-CM | POA: Diagnosis not present

## 2020-05-02 DIAGNOSIS — Z9189 Other specified personal risk factors, not elsewhere classified: Secondary | ICD-10-CM

## 2020-05-02 DIAGNOSIS — K219 Gastro-esophageal reflux disease without esophagitis: Secondary | ICD-10-CM

## 2020-05-02 DIAGNOSIS — J45909 Unspecified asthma, uncomplicated: Secondary | ICD-10-CM

## 2020-05-02 DIAGNOSIS — R7303 Prediabetes: Secondary | ICD-10-CM

## 2020-05-02 DIAGNOSIS — Z0289 Encounter for other administrative examinations: Secondary | ICD-10-CM

## 2020-05-03 ENCOUNTER — Encounter (INDEPENDENT_AMBULATORY_CARE_PROVIDER_SITE_OTHER): Payer: Self-pay | Admitting: Bariatrics

## 2020-05-03 DIAGNOSIS — E559 Vitamin D deficiency, unspecified: Secondary | ICD-10-CM | POA: Insufficient documentation

## 2020-05-03 LAB — LIPID PANEL WITH LDL/HDL RATIO
Cholesterol, Total: 202 mg/dL — ABNORMAL HIGH (ref 100–199)
HDL: 76 mg/dL (ref >39)
LDL Chol Calc (NIH): 117 mg/dL — ABNORMAL HIGH (ref 0–99)
LDL/HDL Ratio: 1.5 ratio (ref 0.0–3.2)
Triglycerides: 50 mg/dL (ref 0–149)
VLDL Cholesterol Cal: 9 mg/dL (ref 5–40)

## 2020-05-03 LAB — COMPREHENSIVE METABOLIC PANEL
ALT: 13 IU/L (ref 0–32)
AST: 15 IU/L (ref 0–40)
Albumin/Globulin Ratio: 1.5 (ref 1.2–2.2)
Albumin: 4.3 g/dL (ref 3.8–4.8)
Alkaline Phosphatase: 86 IU/L (ref 48–121)
BUN/Creatinine Ratio: 11 (ref 9–23)
BUN: 7 mg/dL (ref 6–24)
Bilirubin Total: 0.6 mg/dL (ref 0.0–1.2)
CO2: 25 mmol/L (ref 20–29)
Calcium: 9.1 mg/dL (ref 8.7–10.2)
Chloride: 103 mmol/L (ref 96–106)
Creatinine, Ser: 0.66 mg/dL (ref 0.57–1.00)
GFR calc Af Amer: 125 mL/min/{1.73_m2} (ref >59)
GFR calc non Af Amer: 109 mL/min/{1.73_m2} (ref >59)
Globulin, Total: 2.9 g/dL (ref 1.5–4.5)
Glucose: 75 mg/dL (ref 65–99)
Potassium: 4 mmol/L (ref 3.5–5.2)
Sodium: 138 mmol/L (ref 134–144)
Total Protein: 7.2 g/dL (ref 6.0–8.5)

## 2020-05-03 LAB — T4, FREE: Free T4: 1.29 ng/dL (ref 0.82–1.77)

## 2020-05-03 LAB — HEMOGLOBIN A1C
Est. average glucose Bld gHb Est-mCnc: 108 mg/dL
Hgb A1c MFr Bld: 5.4 % (ref 4.8–5.6)

## 2020-05-03 LAB — TSH: TSH: 0.862 u[IU]/mL (ref 0.450–4.500)

## 2020-05-03 LAB — VITAMIN D 25 HYDROXY (VIT D DEFICIENCY, FRACTURES): Vit D, 25-Hydroxy: 14.2 ng/mL — ABNORMAL LOW (ref 30.0–100.0)

## 2020-05-03 LAB — T3: T3, Total: 123 ng/dL (ref 71–180)

## 2020-05-03 LAB — INSULIN, RANDOM: INSULIN: 4.7 u[IU]/mL (ref 2.6–24.9)

## 2020-05-03 NOTE — Progress Notes (Signed)
Chief Complaint:   OBESITY Lynn Hansen (MR# [id number]) is a 44 y.o. female who presents for evaluation and treatment of obesity and related comorbidities. Current BMI is Body mass index is 37.91 kg/m. Lynn Hansen has been struggling with her weight for many years and has been unsuccessful in either losing weight, maintaining weight loss, or reaching her healthy weight goal.  Lynn Hansen is currently in the action stage of change and ready to dedicate time achieving and maintaining a healthier weight. Lynn Hansen is interested in becoming our patient and working on intensive lifestyle modifications including (but not limited to) diet and exercise for weight loss.  Lynn Hansen is lactose intolerant.  She likes to cook, but notes difficulty planning meals and with knowing what to cook.  She does skip some meals.  Lynn Hansen's habits were reviewed today and are as follows: Her family eats meals together, she thinks her family will eat healthier with her, her desired weight loss is 54 pounds, she started gaining weight in 2002, her heaviest weight ever was 263 pounds, she is a picky eater, she craves fast food, soda, and sometimes sweets (cupcakes), she skips lunch or dinner frequently, she is frequently drinking liquids with calories, she frequently makes poor food choices and she struggles with emotional eating.  Depression Screen Lynn Hansen's Food and Mood (modified PHQ-9) score was 16.  Depression screen PHQ 2/9 05/02/2020  Decreased Interest 3  Down, Depressed, Hopeless 3  PHQ - 2 Score 6  Altered sleeping 1  Tired, decreased energy 3  Change in appetite 2  Feeling bad or failure about yourself  1  Trouble concentrating 2  Moving slowly or fidgety/restless 1  Suicidal thoughts 0  PHQ-9 Score 16  Difficult doing work/chores Very difficult   Subjective:   1. Other fatigue Lynn Hansen denies daytime somnolence and admits to waking up still tired. Patent has a history of symptoms of daytime  fatigue, morning fatigue and snoring. Lynn Hansen generally gets 6 hours of sleep per night, and states that she has generally restful sleep. Snoring is present. Apneic episodes are not present. Epworth Sleepiness Score is 7.  2. SOB (shortness of breath) on exertion Asa notes increasing shortness of breath with exercising and seems to be worsening over time with weight gain. She notes getting out of breath sooner with activity than she used to. This has gotten worse recently. Lynn Hansen denies shortness of breath at rest or orthopnea.  3. Essential hypertension Review: taking medications as instructed, no medication side effects noted, no chest pain on exertion, no dyspnea on exertion, no swelling of ankles.  She is on no medications.  Blood pressure has been well-controlled.  BP Readings from Last 3 Encounters:  05/02/20 (!) 144/81  03/08/20 126/70  03/05/20 122/82   4. Gastroesophageal reflux disease, unspecified whether esophagitis present She has been experiencing acid reflux.  She is not taking any medication for this.  5. Asthma, unspecified asthma severity, unspecified whether complicated, unspecified whether persistent She is taking Flovent.  She also has an albuterol inhaler as needed.  6. Prediabetes Lynn Hansen has a diagnosis of prediabetes based on her elevated HgA1c and was informed this puts her at greater risk of developing diabetes. She continues to work on diet and exercise to decrease her risk of diabetes. She denies nausea or hypoglycemia.  She is taking no medications.  Lab Results  Component Value Date   HGBA1C 5.4 05/02/2020   Lab Results  Component Value Date   INSULIN 4.7 05/02/2020  7. Depression screening Lynn Hansen was screened for depression as part of her new patient workup.  8. At risk for diabetes mellitus Lynn Hansen is at higher than average risk for developing diabetes due to her obesity and prediabetes diagnosis..   Assessment/Plan:   1. Other  fatigue Lynn Hansen does feel that her weight is causing her energy to be lower than it should be. Fatigue may be related to obesity, depression or many other causes. Labs will be ordered, and in the meanwhile, Lynn Hansen will focus on self care including making healthy food choices, increasing physical activity and focusing on stress reduction. - EKG 12-Lead - Comprehensive metabolic panel - VITAMIN D 25 Hydroxy (Vit-D Deficiency, Fractures) - T3 - T4, free - TSH  2. SOB (shortness of breath) on exertion Lynn Hansen does feel that she gets out of breath more easily that she used to when she exercises. Lynn Hansen's shortness of breath appears to be obesity related and exercise induced. She has agreed to work on weight loss and gradually increase exercise to treat her exercise induced shortness of breath. Will continue to monitor closely.  She will gradually increase activity.  3. Essential hypertension Lynn Hansen is working on healthy weight loss and exercise to improve blood pressure control. We will watch for signs of hypotension as she continues her lifestyle modifications.  She will decrease the salt in her diet. - Lipid Panel With LDL/HDL Ratio  4. Gastroesophageal reflux disease, unspecified whether esophagitis present Intensive lifestyle modifications are the first line treatment for this issue. We discussed several lifestyle modifications today and she will continue to work on diet, exercise and weight loss efforts. Orders and follow up as documented in patient record.   Counseling . If a person has gastroesophageal reflux disease (GERD), food and stomach acid move back up into the esophagus and cause symptoms or problems such as damage to the esophagus. . Anti-reflux measures include: raising the head of the bed, avoiding tight clothing or belts, avoiding eating late at night, not lying down shortly after mealtime, and achieving weight loss. . Avoid ASA, NSAID's, caffeine, alcohol, and tobacco.  . OTC  Pepcid and/or Tums are often very helpful for as needed use.  Lynn Hansen However, for persisting chronic or daily symptoms, stronger medications like Omeprazole may be needed. . You may need to avoid foods and drinks such as: ? Coffee and tea (with or without caffeine). ? Drinks that contain alcohol. ? Energy drinks and sports drinks. ? Bubbly (carbonated) drinks or sodas. ? Chocolate and cocoa. ? Peppermint and mint flavorings. ? Garlic and onions. ? Horseradish. ? Spicy and acidic foods. These include peppers, chili powder, curry powder, vinegar, hot sauces, and BBQ sauce. ? Citrus fruit juices and citrus fruits, such as oranges, lemons, and limes. ? Tomato-based foods. These include red sauce, chili, salsa, and pizza with red sauce. ? Fried and fatty foods. These include donuts, french fries, potato chips, and high-fat dressings. ? High-fat meats. These include hot dogs, rib eye steak, sausage, ham, and bacon.  5. Asthma, unspecified asthma severity, unspecified whether complicated, unspecified whether persistent Dhanvi will continue her medications as needed.  6. Prediabetes Yenty will continue to work on weight loss, exercise, and decreasing simple carbohydrates to help decrease the risk of diabetes.  She will decrease carbohydrates and increase protein.  - Hemoglobin A1c - Insulin, random  7. Depression screening Rindy had a positive depression screening. Depression is commonly associated with obesity and often results in emotional eating behaviors. We will monitor this closely  and work on CBT to help improve the non-hunger eating patterns. Referral to Psychology may be required if no improvement is seen as she continues in our clinic.  8. At risk for diabetes mellitus Brendan was given approximately 15 minutes of diabetes education and counseling today. We discussed intensive lifestyle modifications today with an emphasis on weight loss as well as increasing exercise and decreasing  simple carbohydrates in her diet. We also reviewed medication options with an emphasis on risk versus benefit of those discussed.   Repetitive spaced learning was employed today to elicit superior memory formation and behavioral change.  9. Class 2 severe obesity with serious comorbidity and body mass index (BMI) of 38.0 to 38.9 in adult, unspecified obesity type (HCC) Abony is currently in the action stage of change and her goal is to continue with weight loss efforts. I recommend Agnes begin the structured treatment plan as follows:  She has agreed to the Category 1 Plan +100 calories.  She will work on meal planning, intentional eating, and will stop all sugary drinks.  Exercise goals: No exercise has been prescribed at this time.   Behavioral modification strategies: increasing lean protein intake, decreasing simple carbohydrates, increasing vegetables, increasing water intake, decreasing eating out, no skipping meals, meal planning and cooking strategies and keeping healthy foods in the home.  She was informed of the importance of frequent follow-up visits to maximize her success with intensive lifestyle modifications for her multiple health conditions. She was informed we would discuss her lab results at her next visit unless there is a critical issue that needs to be addressed sooner. Blanca agreed to keep her next visit at the agreed upon time to discuss these results.  Objective:   Blood pressure (!) 144/81, pulse 78, temperature 98.8 F (37.1 C), height 5\' 3"  (1.6 m), weight 214 lb (97.1 kg), last menstrual period 04/24/2020, SpO2 98 %. Body mass index is 37.91 kg/m.  EKG: Normal sinus rhythm, rate 76 bpm.  Indirect Calorimeter completed today shows a VO2 of 220 and a REE of 1533.  Her calculated basal metabolic rate is [id number] thus her basal metabolic rate is worse than expected.  General: Cooperative, alert, well developed, in no acute distress. HEENT: Conjunctivae and lids  unremarkable. Cardiovascular: Regular rhythm.  Lungs: Normal work of breathing. Neurologic: No focal deficits.   Lab Results  Component Value Date   CREATININE 0.66 05/02/2020   BUN 7 05/02/2020   NA 138 05/02/2020   K 4.0 05/02/2020   CL 103 05/02/2020   CO2 25 05/02/2020   Lab Results  Component Value Date   ALT 13 05/02/2020   AST 15 05/02/2020   ALKPHOS 86 05/02/2020   BILITOT 0.6 05/02/2020   Lab Results  Component Value Date   HGBA1C 5.4 05/02/2020   HGBA1C 5.5 07/26/2019   HGBA1C 6.0 08/31/2018   HGBA1C 5.8 (H) 01/08/2017   Lab Results  Component Value Date   INSULIN 4.7 05/02/2020   Lab Results  Component Value Date   TSH 0.862 05/02/2020   Lab Results  Component Value Date   CHOL 202 (H) 05/02/2020   HDL 76 05/02/2020   LDLCALC 117 (H) 05/02/2020   TRIG 50 05/02/2020   CHOLHDL 3 07/26/2019   Lab Results  Component Value Date   WBC 6.6 01/28/2017   HGB 12.4 01/28/2017   HCT 37.1 01/28/2017   MCV 80.7 01/28/2017   PLT 399.0 01/28/2017   Attestation Statements:   Reviewed by clinician on day  of visit: allergies, medications, problem list, medical history, surgical history, family history, social history, and previous encounter notes.  I, Water quality scientist, CMA, am acting as Location manager for [employer] DO.  I have reviewed the above documentation for accuracy and completeness, and I agree with the above. Jearld Lesch, DO

## 2020-05-07 ENCOUNTER — Encounter (INDEPENDENT_AMBULATORY_CARE_PROVIDER_SITE_OTHER): Payer: Self-pay | Admitting: Bariatrics

## 2020-05-16 ENCOUNTER — Ambulatory Visit (INDEPENDENT_AMBULATORY_CARE_PROVIDER_SITE_OTHER): Payer: BC Managed Care – PPO | Admitting: Bariatrics

## 2020-05-16 ENCOUNTER — Encounter (INDEPENDENT_AMBULATORY_CARE_PROVIDER_SITE_OTHER): Payer: Self-pay

## 2020-05-22 ENCOUNTER — Telehealth: Payer: Self-pay

## 2020-05-22 NOTE — Telephone Encounter (Signed)
Patient is calling regards to wanting an appointment to discuss options for treating fibroids. Patient states her last 3 menstrual cycles have been heavy and bad.

## 2020-05-22 NOTE — Telephone Encounter (Signed)
AEX 05/2019 with DL, cancelled next AEX with DL on 05/23/20. OV 03/08/20-PUS enlarged uterus LMP 05/21/20, no contraception Abnormal lipids and vit D per A. Owens Shark, DO on 05/02/20  Spoke with pt. Pt calling to report for last 3 months cycle sx with fibroids have increased. Pt states having heavy bleeding with changing 2 overnight pads every 1-2 hours, small clots, cramps in legs and back, abd pain and nausea, and some occasional dizziness. Pt also reporting night sweats this month.  Pt also reporting having to miss work due to pain and bleeding.  Pt denies fever, chills, vomiting or diarrhea.  Advised pt to have OV to discuss fibroid pain and treatment options. Pt agreeable. Pt scheduled with Dr Sabra Heck on 06/01/20 at 1 pm per pt's request of date and time. Earlier appts declined due to work schedule. Pt agreeable and verbalized understanding. Pt given ER precautions for heavy bleeding or feeling dizzy, lightheaded or weak. Pt agreeable.   Routing to Dr Sabra Heck for review. Encounter closed.

## 2020-05-23 ENCOUNTER — Ambulatory Visit: Payer: 59 | Admitting: Certified Nurse Midwife
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
## 2020-06-01 ENCOUNTER — Encounter: Payer: Self-pay | Admitting: Obstetrics & Gynecology

## 2020-06-01 ENCOUNTER — Telehealth: Payer: Self-pay | Admitting: Obstetrics & Gynecology

## 2020-06-01 ENCOUNTER — Ambulatory Visit: Payer: Self-pay | Admitting: Obstetrics & Gynecology

## 2020-06-01 NOTE — Telephone Encounter (Signed)
Patient dnka her appointment today for fibroid pain. I left her a message to call and reschedule. [facility] policy followed.

## 2020-06-06 IMAGING — MG DIGITAL SCREENING BILATERAL MAMMOGRAM WITH TOMO AND CAD
6 of 12 series · 6 of 36 positions shown · non-contrast
Comparison: Previous exam(s).

CLINICAL DATA: Screening.

EXAM:
DIGITAL SCREENING BILATERAL MAMMOGRAM WITH TOMO AND CAD

[L CC synth-2D (1 of 2)]
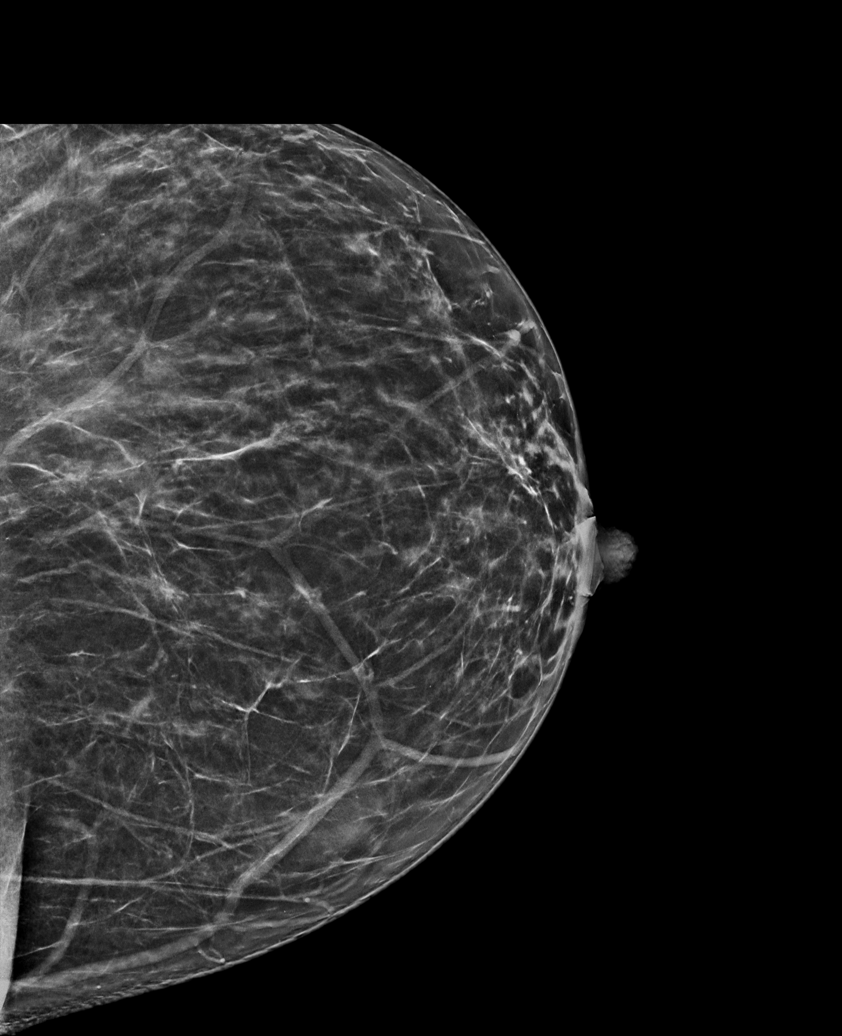

[L MLO synth-2D]
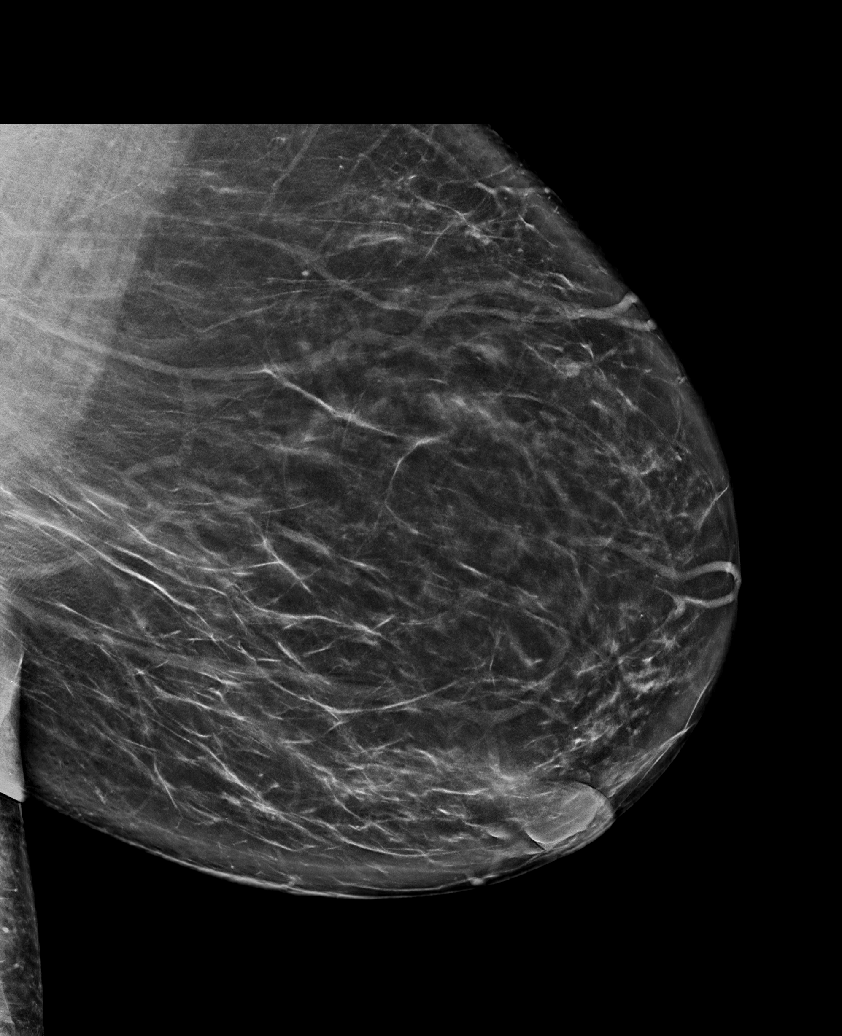

[R CC synth-2D (1 of 2)]
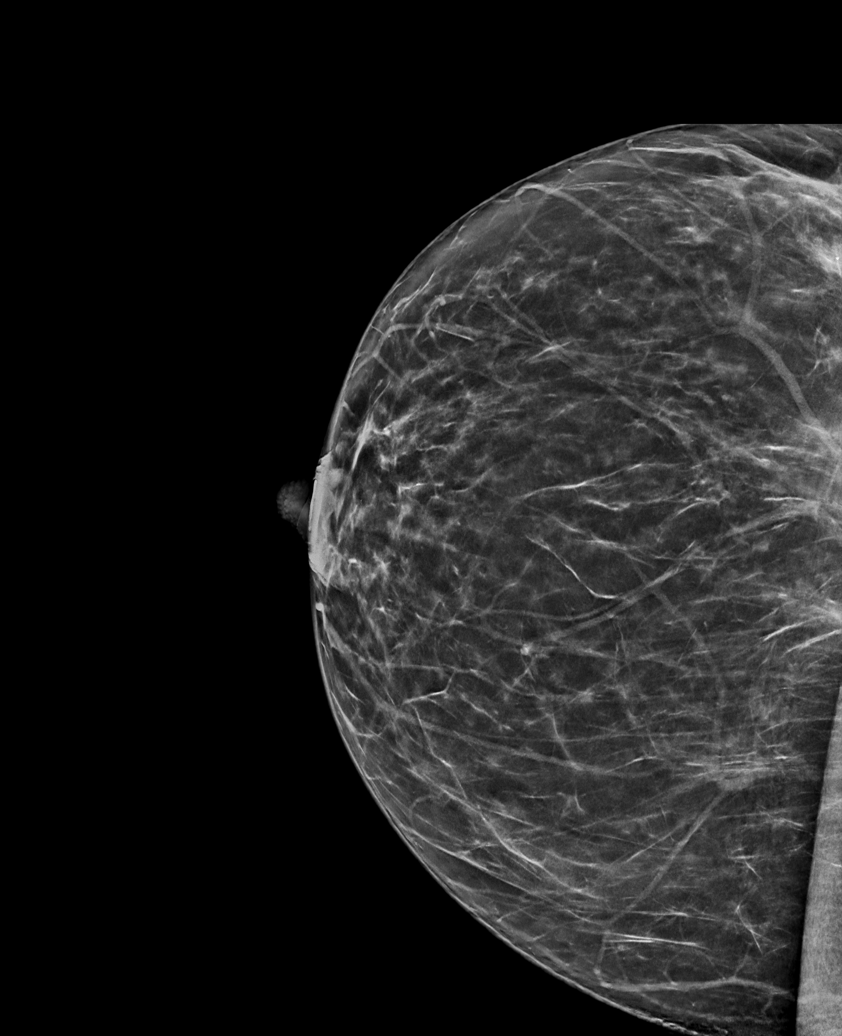

[R MLO synth-2D]
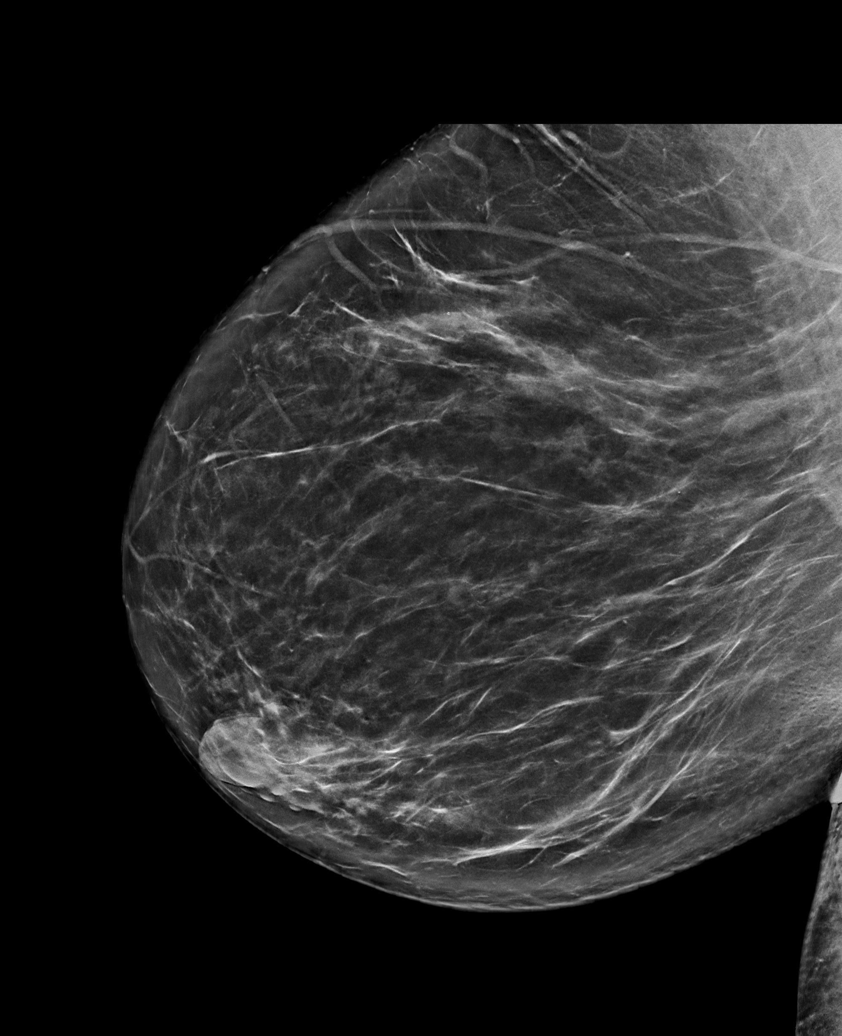

[R CC synth-2D (2 of 2)]
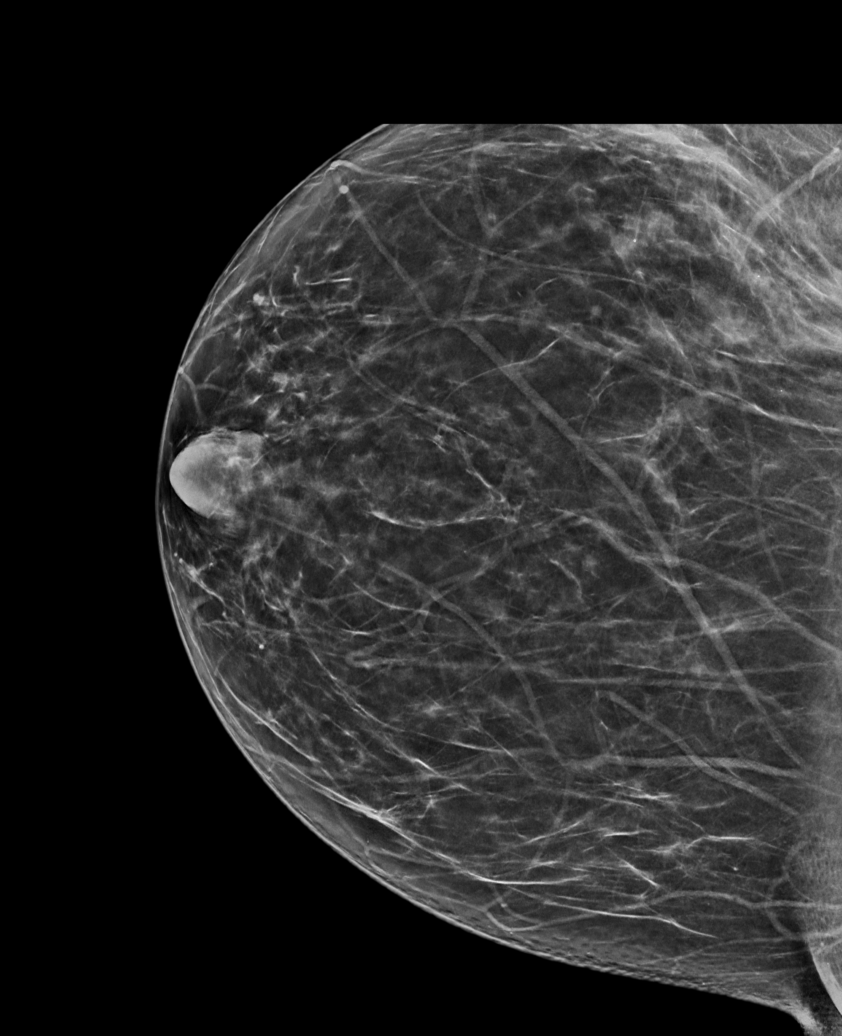

[L CC synth-2D (2 of 2)]
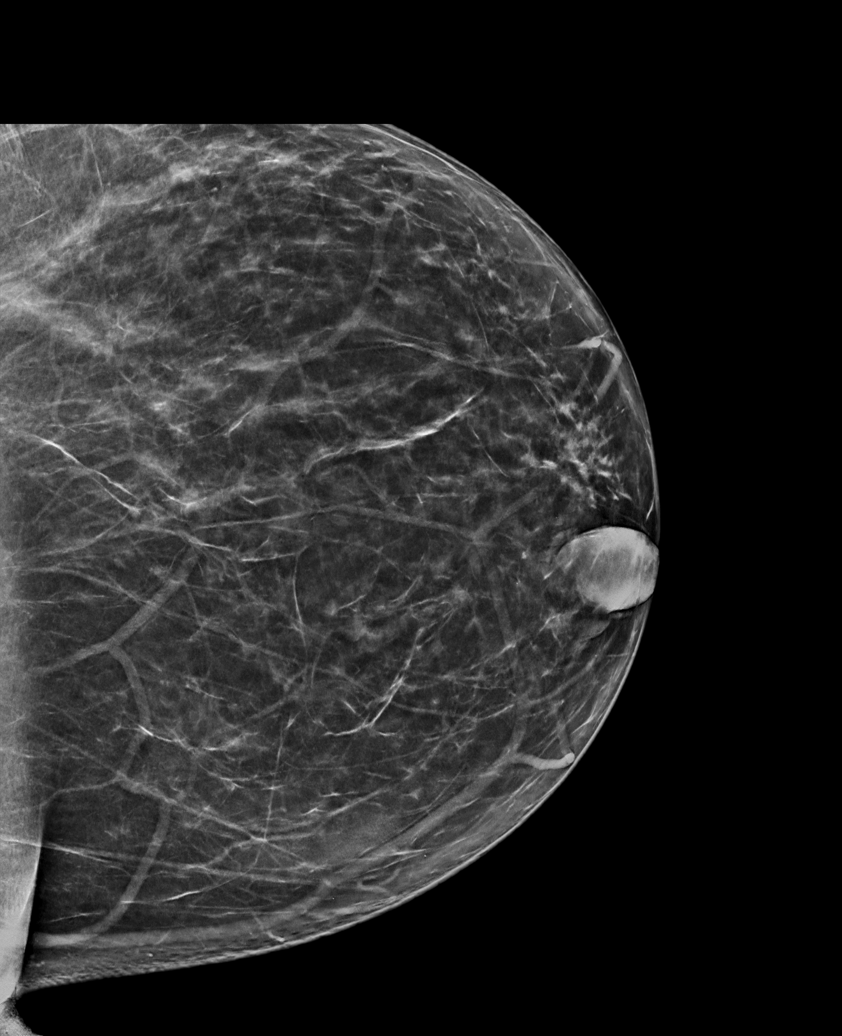

[6 of 36 positions shown; findings below may reference images not displayed]

ACR Breast Density Category b: There are scattered areas of
fibroglandular density.
FINDINGS: There are no findings suspicious for malignancy. Images were
processed with CAD.
IMPRESSION: No mammographic evidence of malignancy. A result letter of this
screening mammogram will be mailed directly to the patient.

RECOMMENDATION:
Screening mammogram in one year. (Code:[id number])

BI-RADS CATEGORY  1: Negative.

## 2020-07-05 ENCOUNTER — Encounter: Payer: Self-pay | Admitting: Surgery

## 2020-07-27 ENCOUNTER — Encounter: Payer: 59 | Admitting: Family Medicine

## 2020-08-31 ENCOUNTER — Ambulatory Visit
Admission: RE | Admit: 2020-08-31 | Discharge: 2020-08-31 | Disposition: A | Discharge status: Home / Self Care | Payer: BC Managed Care – PPO | Source: Ambulatory Visit | Attending: *Deleted | Admitting: *Deleted

## 2020-08-31 ENCOUNTER — Other Ambulatory Visit: Payer: Self-pay

## 2020-08-31 DIAGNOSIS — Z1231 Encounter for screening mammogram for malignant neoplasm of breast: Secondary | ICD-10-CM

## 2020-09-04 ENCOUNTER — Other Ambulatory Visit: Payer: Self-pay | Admitting: *Deleted

## 2020-09-04 ENCOUNTER — Other Ambulatory Visit: Payer: Self-pay | Admitting: Family Medicine

## 2020-09-04 DIAGNOSIS — R928 Other abnormal and inconclusive findings on diagnostic imaging of breast: Secondary | ICD-10-CM

## 2020-09-11 ENCOUNTER — Encounter: Payer: BC Managed Care – PPO | Admitting: Family Medicine

## 2020-09-17 ENCOUNTER — Ambulatory Visit
Admission: RE | Admit: 2020-09-17 | Discharge: 2020-09-17 | Disposition: A | Discharge status: Home / Self Care | Payer: BC Managed Care – PPO | Source: Ambulatory Visit | Attending: Family Medicine | Admitting: Family Medicine

## 2020-09-17 ENCOUNTER — Other Ambulatory Visit: Payer: Self-pay

## 2020-09-17 DIAGNOSIS — R928 Other abnormal and inconclusive findings on diagnostic imaging of breast: Secondary | ICD-10-CM

## 2020-09-25 ENCOUNTER — Telehealth: Payer: Self-pay

## 2020-09-25 NOTE — Telephone Encounter (Signed)
Attempted to return pt's call, no answer and VM box not set up. Will send Mychart message.

## 2020-09-25 NOTE — Telephone Encounter (Signed)
Patient would like to discuss getting fibroids removed.

## 2020-09-28 ENCOUNTER — Encounter: Payer: Self-pay | Admitting: Family Medicine

## 2020-09-28 ENCOUNTER — Ambulatory Visit (INDEPENDENT_AMBULATORY_CARE_PROVIDER_SITE_OTHER): Payer: BC Managed Care – PPO | Admitting: Family Medicine

## 2020-09-28 ENCOUNTER — Other Ambulatory Visit: Payer: Self-pay

## 2020-09-28 VITALS — BP 128/80 | HR 79 | Resp 16 | Ht 63.0 in | Wt 221.0 lb

## 2020-09-28 DIAGNOSIS — N938 Other specified abnormal uterine and vaginal bleeding: Secondary | ICD-10-CM

## 2020-09-28 DIAGNOSIS — Z0000 Encounter for general adult medical examination without abnormal findings: Secondary | ICD-10-CM | POA: Diagnosis not present

## 2020-09-28 DIAGNOSIS — E559 Vitamin D deficiency, unspecified: Secondary | ICD-10-CM | POA: Diagnosis not present

## 2020-09-28 DIAGNOSIS — I10 Essential (primary) hypertension: Secondary | ICD-10-CM

## 2020-09-28 DIAGNOSIS — Z1159 Encounter for screening for other viral diseases: Secondary | ICD-10-CM

## 2020-09-28 DIAGNOSIS — J309 Allergic rhinitis, unspecified: Secondary | ICD-10-CM

## 2020-09-28 DIAGNOSIS — M7542 Impingement syndrome of left shoulder: Secondary | ICD-10-CM

## 2020-09-28 DIAGNOSIS — J4520 Mild intermittent asthma, uncomplicated: Secondary | ICD-10-CM

## 2020-09-28 MED ORDER — MONTELUKAST SODIUM 10 MG PO TABS: ORAL_TABLET | ORAL | 2 refills | Status: DC

## 2020-09-28 MED ORDER — FLOVENT HFA 110 MCG/ACT IN AERO: 2.0000 | INHALATION_SPRAY | Freq: Two times a day (BID) | RESPIRATORY_TRACT | 6 refills | Status: DC

## 2020-09-28 NOTE — Patient Instructions (Addendum)
Today you have you routine preventive visit. A few things to remember from today's visit:   Routine general medical examination at a health care facility  Encounter for HCV screening test for low risk patient - Plan: Hepatitis C antibody  Essential hypertension  Vitamin D deficiency, unspecified - Plan: VITAMIN D 25 Hydroxy (Vit-D Deficiency, Fractures)  Asthma, intermittent, uncomplicated - Plan: montelukast (SINGULAIR) 10 MG tablet, fluticasone (FLOVENT HFA) 110 MCG/ACT inhaler  Allergic rhinitis, unspecified seasonality, unspecified trigger - Plan: montelukast (SINGULAIR) 10 MG tablet  DUB (dysfunctional uterine bleeding) - Plan: POCT urine pregnancy  Impingement syndrome of left shoulder - Plan: Ambulatory referral to Physical Therapy  If you need refills please call your pharmacy. Do not use My Chart to request refills or for acute issues that need immediate attention.   Nasal saline irrigation as needed. Resume Singulair at night, Cetirizine 10 mg am. No changes in rest of meds.  PT appt will be arranged for shoulder pain.  Please be sure medication list is accurate. If a new problem present, please set up appointment sooner than planned today.  At least 150 minutes of moderate exercise per week, daily brisk walking for 15-30 min is a good exercise option. Healthy diet low in saturated (animal) fats and sweets and consisting of fresh fruits and vegetables, lean meats such as fish and white chicken and whole grains.  These are some of recommendations for screening depending of age and risk factors:  - Vaccines:  Tdap vaccine every 10 years.  Shingles vaccine recommended at age 3, could be given after 44 years of age but not sure about insurance coverage.   Pneumonia vaccines: Pneumovax at 25. Sometimes Pneumovax is giving earlier if history of smoking, lung disease,diabetes,kidney disease among some.  Screening for diabetes at age 65 and every 3 years.  Cervical  cancer prevention:  Pap smear starts at 43 years of age and continues periodically until 44 years old in low risk women. Pap smear every 3 years between 37 and 17 years old. Pap smear every 3-5 years between women 60 and older if pap smear negative and HPV screening negative.   -Breast cancer: Mammogram: There is disagreement between experts about when to start screening in low risk asymptomatic female but recent recommendations are to start screening at 12 and not later than 44 years old , every 1-2 years and after 44 yo q 2 years. Screening is recommended until 44 years old but some women can continue screening depending of healthy issues.  Colon cancer screening: Has been recently changed to 44 yo. Insurance may not cover until you are 44 years old. Screening is recommended until 44 years old.  Cholesterol disorder screening at age 35 and every 3 years.  Also recommended:  1. Dental visit- Brush and floss your teeth twice daily; visit your dentist twice a year. 2. Eye doctor- Get an eye exam at least every 2 years. 3. Helmet use- Always wear a helmet when riding a bicycle, motorcycle, rollerblading or skateboarding. 4. Safe sex- If you may be exposed to sexually transmitted infections, use a condom. 5. Seat belts- Seat belts can save your live; always wear one. 6. Smoke/Carbon Monoxide detectors- These detectors need to be installed on the appropriate level of your home. Replace batteries at least once a year. 7. Skin cancer- When out in the sun please cover up and use sunscreen 15 SPF or higher. 8. Violence- If anyone is threatening or hurting you, please tell your healthcare provider.  9. Drink alcohol in moderation- Limit alcohol intake to one drink or less per day. Never drink and drive. 10. Calcium supplementation 1000 to 1200 mg daily, ideally through your diet.  Vitamin D supplementation 800 units daily.
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
## 2020-10-01 LAB — HEPATITIS C ANTIBODY
Hepatitis C Ab: NONREACTIVE
SIGNAL TO CUT-OFF: 0.03 (ref <1.00)

## 2020-10-01 LAB — VITAMIN D 25 HYDROXY (VIT D DEFICIENCY, FRACTURES): Vit D, 25-Hydroxy: 12 ng/mL — ABNORMAL LOW (ref 30–100)

## 2020-10-02 NOTE — Telephone Encounter (Signed)
Attempted multiple times to contact pt via telephone and mychart. Mychart message not read from 09/25/20. Will close encounter now.

## 2020-10-23 ENCOUNTER — Encounter: Payer: Self-pay | Admitting: Obstetrics & Gynecology

## 2020-10-29 ENCOUNTER — Telehealth: Payer: Self-pay

## 2020-10-29 DIAGNOSIS — D251 Intramural leiomyoma of uterus: Secondary | ICD-10-CM

## 2020-10-29 NOTE — Telephone Encounter (Signed)
Spoke with patient. Last AEX 03-05-20 with D.Leonard, CNM. She had PUS 03-08-20 for fibroids with Dr.Miller and reviewed options for treatment. Patient wanted to hold off on any treatment at that time.  She calls today stating she had 3 cycles in October. Her last cycle was 10-15-20 for 5 days. It was extremely heavy and she was very nauseated. The first full day she was changing 2 overnight pads every hour and passing small clots. She would like to come back into office and discuss options for fibroids again. She is using condoms for birth control most of the time. Advised patient to do home UPT and let me know results.  Will route to provider to see if needs appointment with PUS on same day.  Routed to Dr.Silva

## 2020-10-29 NOTE — Telephone Encounter (Signed)
Patient is calling in regards to discuss options about fibroids.

## 2020-10-29 NOTE — Telephone Encounter (Signed)
Left message to call Domani Bakos, CMA. °

## 2020-10-30 NOTE — Telephone Encounter (Signed)
OK for office visit without ultrasound same day.

## 2020-10-30 NOTE — Telephone Encounter (Signed)
Call to patient. Message given to patient as seen below from Dr. Quincy Simmonds and patient verbalized understanding. Patient agreeable to schedule PUS. PUS scheduled for 11-15-20 at 0800 with consult with Dr. Quincy Simmonds at [place]. Patient agreeable to date and time of appointment. Declined earlier appointment. Order placed for PUS for precert. Patient aware will be contacted with benefits.   Routing to provider and will close encounter.

## 2020-10-31 ENCOUNTER — Telehealth: Payer: Self-pay

## 2020-10-31 NOTE — Telephone Encounter (Signed)
Spoke with patient regarding benefits for scheduled Pelvic ultrasound. Patient acknowledges understanding of information presented.   Patient stated that she was told to take a UPT to rule out pregnancy. Patient stated that she took a test and it was negative. Routing to Echo and Dr. Quincy Simmonds as Juluis Rainier.  Encounter closed.

## 2020-11-06 ENCOUNTER — Ambulatory Visit: Payer: BC Managed Care – PPO | Admitting: Physical Therapy

## 2020-11-13 ENCOUNTER — Ambulatory Visit: Payer: BC Managed Care – PPO | Admitting: Physical Therapy

## 2020-11-15 ENCOUNTER — Ambulatory Visit: Payer: BC Managed Care – PPO | Admitting: Obstetrics and Gynecology

## 2020-11-15 ENCOUNTER — Other Ambulatory Visit: Payer: Self-pay

## 2020-11-15 ENCOUNTER — Ambulatory Visit (INDEPENDENT_AMBULATORY_CARE_PROVIDER_SITE_OTHER): Payer: BC Managed Care – PPO

## 2020-11-15 ENCOUNTER — Encounter: Payer: Self-pay | Admitting: Obstetrics and Gynecology

## 2020-11-15 ENCOUNTER — Other Ambulatory Visit: Payer: Self-pay | Admitting: Obstetrics and Gynecology

## 2020-11-15 VITALS — BP 160/82 | HR 80 | Ht 62.5 in | Wt 221.0 lb

## 2020-11-15 DIAGNOSIS — N921 Excessive and frequent menstruation with irregular cycle: Secondary | ICD-10-CM

## 2020-11-15 DIAGNOSIS — D219 Benign neoplasm of connective and other soft tissue, unspecified: Secondary | ICD-10-CM

## 2020-11-15 DIAGNOSIS — D251 Intramural leiomyoma of uterus: Secondary | ICD-10-CM

## 2020-11-15 MED ORDER — NORETHINDRONE 0.35 MG PO TABS: 1.0000 | ORAL_TABLET | Freq: Every day | ORAL | 0 refills | Status: DC

## 2020-11-15 NOTE — Patient Instructions (Addendum)
Norethindrone tablets (contraception) What is this medicine? NORETHINDRONE (nor eth IN drone) is an oral contraceptive. The product contains a female hormone known as a progestin. It is used to prevent pregnancy. This medicine may be used for other purposes; ask your health care provider or pharmacist if you have questions. COMMON BRAND NAME(S): Camila, Deblitane 28-Day, Errin, Heather, Jencycla, Jolivette, Lyza, Nor-QD, Nora-BE, Norlyroc, Ortho Micronor, Sharobel 28-Day What should I tell my health care provider before I take this medicine? They need to know if you have any of these conditions:  blood vessel disease or blood clots  breast, cervical, or vaginal cancer  diabetes  heart disease  kidney disease  liver disease  mental depression  migraine  seizures  stroke  vaginal bleeding  an unusual or allergic reaction to norethindrone, other medicines, foods, dyes, or preservatives  pregnant or trying to get pregnant  breast-feeding How should I use this medicine? Take this medicine by mouth with a glass of water. You may take it with or without food. Follow the directions on the prescription label. Take this medicine at the same time each day and in the order directed on the package. Do not take your medicine more often than directed. Contact your pediatrician regarding the use of this medicine in children. Special care may be needed. This medicine has been used in female children who have started having menstrual periods. A patient package insert for the product will be given with each prescription and refill. Read this sheet carefully each time. The sheet may change frequently. Overdosage: If you think you have taken too much of this medicine contact a poison control center or emergency room at once. NOTE: This medicine is only for you. Do not share this medicine with others. What if I miss a dose? Try not to miss a dose. Every time you miss a dose or take a dose late  your chance of pregnancy increases. When 1 pill is missed (even if only 3 hours late), take the missed pill as soon as possible and continue taking a pill each day at the regular time (use a back up method of birth control for the next 48 hours). If more than 1 dose is missed, use an additional birth control method for the rest of your pill pack until menses occurs. Contact your health care professional if more than 1 dose has been missed. What may interact with this medicine? Do not take this medicine with any of the following medications:  amprenavir or fosamprenavir  bosentan This medicine may also interact with the following medications:  antibiotics or medicines for infections, especially rifampin, rifabutin, rifapentine, and griseofulvin, and possibly penicillins or tetracyclines  aprepitant  barbiturate medicines, such as phenobarbital  carbamazepine  felbamate  modafinil  oxcarbazepine  phenytoin  ritonavir or other medicines for HIV infection or AIDS  St. John's wort  topiramate This list may not describe all possible interactions. Give your health care provider a list of all the medicines, herbs, non-prescription drugs, or dietary supplements you use. Also tell them if you smoke, drink alcohol, or use illegal drugs. Some items may interact with your medicine. What should I watch for while using this medicine? Visit your doctor or health care professional for regular checks on your progress. You will need a regular breast and pelvic exam and Pap smear while on this medicine. Use an additional method of birth control during the first cycle that you take these tablets. If you have any reason to think you   are pregnant, stop taking this medicine right away and contact your doctor or health care professional. If you are taking this medicine for hormone related problems, it may take several cycles of use to see improvement in your condition. This medicine does not protect you  against HIV infection (AIDS) or any other sexually transmitted diseases. What side effects may I notice from receiving this medicine? Side effects that you should report to your doctor or health care professional as soon as possible:  breast tenderness or discharge  pain in the abdomen, chest, groin or leg  severe headache  skin rash, itching, or hives  sudden shortness of breath  unusually weak or tired  vision or speech problems  yellowing of skin or eyes Side effects that usually do not require medical attention (report to your doctor or health care professional if they continue or are bothersome):  changes in sexual desire  change in menstrual flow  facial hair growth  fluid retention and swelling  headache  irritability  nausea  weight gain or loss This list may not describe all possible side effects. Call your doctor for medical advice about side effects. You may report side effects to FDA at 1-800-FDA-1088. Where should I keep my medicine? Keep out of the reach of children. Store at room temperature between 15 and 30 degrees C (59 and 86 degrees F). Throw away any unused medicine after the expiration date. NOTE: This sheet is a summary. It may not cover all possible information. If you have questions about this medicine, talk to your doctor, pharmacist, or health care provider.  2020 Elsevier/Gold Standard (2012-08-20 16:41:35)   Endometrial Biopsy  Endometrial biopsy is a procedure in which a tissue sample is taken from inside the uterus. The sample is taken from the endometrium, which is the lining of the uterus. The tissue sample is then checked under a microscope to see if the tissue is normal or abnormal. This procedure helps to determine where you are in your menstrual cycle and how hormone levels are affecting the lining of the uterus. This procedure may also be used to evaluate uterine bleeding or to diagnose endometrial cancer, endometrial tuberculosis,  polyps, or other inflammatory conditions. Tell a health care provider about:  Any allergies you have.  All medicines you are taking, including vitamins, herbs, eye drops, creams, and over-the-counter medicines.  Any problems you or family members have had with anesthetic medicines.  Any blood disorders you have.  Any surgeries you have had.  Any medical conditions you have.  Whether you are pregnant or may be pregnant. What are the risks? Generally, this is a safe procedure. However, problems may occur, including:  Bleeding.  Pelvic infection.  Puncture of the wall of the uterus with the biopsy device (rare). What happens before the procedure?  Keep a record of your menstrual cycles as told by your health care provider. You may need to schedule your procedure for a specific time in your cycle.  You may want to bring a sanitary pad to wear after the procedure.  Ask your health care provider about: ? Changing or stopping your regular medicines. This is especially important if you are taking diabetes medicines or blood thinners. ? Taking medicines such as aspirin and ibuprofen. These medicines can thin your blood. Do not take these medicines before your procedure if your health care provider instructs you not to.  Plan to have someone take you home from the hospital or clinic. What happens during the procedure?  To lower your risk of infection: ? Your health care team will wash or sanitize their hands.  You will lie on an exam table with your feet and legs supported as in a pelvic exam.  Your health care provider will insert an instrument (speculum) into your vagina to see your cervix.  Your cervix will be cleansed with an antiseptic solution.  A medicine (local anesthetic) will be used to numb the cervix.  A forceps instrument (tenaculum) will be used to hold your cervix steady for the biopsy.  A thin, rod-like instrument (uterine sound) will be inserted through your  cervix to determine the length of your uterus and the location where the biopsy sample will be removed.  A thin, flexible tube (catheter) will be inserted through your cervix and into the uterus. The catheter will be used to collect the biopsy sample from your endometrial tissue.  The catheter and speculum will then be removed, and the tissue sample will be sent to a lab for examination. What happens after the procedure?  You will rest in a recovery area until you are ready to go home.  You may have mild cramping and a small amount of vaginal bleeding. This is normal.  It is up to you to get the results of your procedure. Ask your health care provider, or the department that is doing the procedure, when your results will be ready. Summary  Endometrial biopsy is a procedure in which a tissue sample is taken from the endometrium, which is the lining of the uterus.  This procedure may help to diagnose menstrual cycle problems, abnormal bleeding, or other conditions affecting the endometrium.  Before the procedure, keep a record of your menstrual cycles as told by your health care provider.  The tissue sample that is removed will be checked under a microscope to see if it is normal or abnormal. This information is not intended to replace advice given to you by your health care provider. Make sure you discuss any questions you have with your health care provider. Document Revised: 11/13/2017 Document Reviewed: 12/17/2016 Elsevier Patient Education  [place].
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
## 2020-11-20 ENCOUNTER — Ambulatory Visit: Payer: BC Managed Care – PPO | Attending: Family Medicine | Admitting: Physical Therapy

## 2020-11-22 ENCOUNTER — Telehealth: Payer: Self-pay

## 2020-11-22 NOTE — Telephone Encounter (Signed)
Call placed to convey benefits for endometrial biopsy. Spoke with the patient and conveyed the benefits. Patient understands/agreeable with the benefits.    Please call patient for scheduling endometrial biopsy.

## 2020-11-23 NOTE — Telephone Encounter (Signed)
Left message for pt to return call to triage RN. 

## 2020-11-27 ENCOUNTER — Encounter: Payer: BC Managed Care – PPO | Admitting: Physical Therapy

## 2020-11-27 NOTE — Telephone Encounter (Signed)
Spoke with pt. Pt states needing to schedule EMB. Pt scheduled with Dr Quincy Simmonds on 12/27 at 1130 am. Pt verbalized understanding to date and time of appt.  Pt advised to take Motrin 800 mg with food and water one hour before procedure. Pt agreeable.  Routing to Dr Quincy Simmonds for update  Encounter closed [id number] for update, pt aware of PR for benefits.

## 2020-12-04 ENCOUNTER — Encounter: Payer: BC Managed Care – PPO | Admitting: Physical Therapy

## 2020-12-05 ENCOUNTER — Other Ambulatory Visit: Payer: Self-pay | Admitting: Obstetrics and Gynecology

## 2020-12-10 ENCOUNTER — Other Ambulatory Visit: Payer: Self-pay

## 2020-12-10 ENCOUNTER — Ambulatory Visit: Payer: BC Managed Care – PPO | Admitting: Obstetrics and Gynecology

## 2020-12-10 ENCOUNTER — Other Ambulatory Visit (HOSPITAL_COMMUNITY)
Admission: RE | Admit: 2020-12-10 | Discharge: 2020-12-10 | Disposition: A | Discharge status: Home / Self Care | Payer: BC Managed Care – PPO | Source: Ambulatory Visit | Attending: Obstetrics and Gynecology | Admitting: Obstetrics and Gynecology

## 2020-12-10 ENCOUNTER — Encounter: Payer: Self-pay | Admitting: Obstetrics and Gynecology

## 2020-12-10 VITALS — BP 146/84 | HR 96 | Ht 62.5 in | Wt 221.0 lb

## 2020-12-10 DIAGNOSIS — N921 Excessive and frequent menstruation with irregular cycle: Secondary | ICD-10-CM | POA: Insufficient documentation

## 2020-12-10 DIAGNOSIS — Z5181 Encounter for therapeutic drug level monitoring: Secondary | ICD-10-CM | POA: Diagnosis not present

## 2020-12-10 MED ORDER — NORETHINDRONE 0.35 MG PO TABS
1.0000 | ORAL_TABLET | Freq: Every day | ORAL | 0 refills | Status: DC
Start: 2020-12-10 — End: 2021-04-19
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
## 2020-12-10 NOTE — Patient Instructions (Signed)

## 2020-12-11 LAB — CBC
Hematocrit: 37 % (ref 34.0–46.6)
Hemoglobin: 11.2 g/dL (ref 11.1–15.9)
MCH: 24.2 pg — ABNORMAL LOW (ref 26.6–33.0)
MCHC: 30.3 g/dL — ABNORMAL LOW (ref 31.5–35.7)
MCV: 80 fL (ref 79–97)
Platelets: 432 10*3/uL (ref 150–450)
RBC: 4.62 x10E6/uL (ref 3.77–5.28)
RDW: 14.9 % (ref 11.7–15.4)
WBC: 6.9 10*3/uL (ref 3.4–10.8)

## 2020-12-12 LAB — SURGICAL PATHOLOGY

## 2020-12-18 ENCOUNTER — Other Ambulatory Visit: Payer: Self-pay

## 2020-12-18 DIAGNOSIS — N840 Polyp of corpus uteri: Secondary | ICD-10-CM

## 2021-02-14 ENCOUNTER — Other Ambulatory Visit: Payer: BC Managed Care – PPO | Admitting: Obstetrics and Gynecology

## 2021-02-14 ENCOUNTER — Other Ambulatory Visit: Payer: BC Managed Care – PPO
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
## 2021-03-28 ENCOUNTER — Ambulatory Visit: Payer: BC Managed Care – PPO | Admitting: Obstetrics and Gynecology

## 2021-03-28 ENCOUNTER — Ambulatory Visit: Payer: BC Managed Care – PPO | Admitting: Allergy

## 2021-04-19 ENCOUNTER — Ambulatory Visit: Payer: BC Managed Care – PPO | Admitting: Obstetrics & Gynecology

## 2021-04-19 ENCOUNTER — Encounter: Payer: Self-pay | Admitting: Obstetrics & Gynecology

## 2021-04-19 ENCOUNTER — Other Ambulatory Visit: Payer: Self-pay

## 2021-04-19 DIAGNOSIS — N898 Other specified noninflammatory disorders of vagina: Secondary | ICD-10-CM | POA: Diagnosis not present

## 2021-04-19 DIAGNOSIS — A599 Trichomoniasis, unspecified: Secondary | ICD-10-CM

## 2021-04-19 DIAGNOSIS — Z113 Encounter for screening for infections with a predominantly sexual mode of transmission: Secondary | ICD-10-CM | POA: Diagnosis not present

## 2021-04-19 DIAGNOSIS — R350 Frequency of micturition: Secondary | ICD-10-CM

## 2021-04-19 LAB — WET PREP FOR TRICH, YEAST, CLUE

## 2021-04-19 MED ORDER — METRONIDAZOLE 500 MG PO TABS: 500.0000 mg | ORAL_TABLET | Freq: Two times a day (BID) | ORAL | 0 refills | Status: AC

## 2021-04-19 NOTE — Progress Notes (Signed)
    Lynn Hansen [birth year] [id number]        45 y.o.  [id number] Stable boyfriend  RP: Vaginal discharge with irritation x 3 days  HPI: Vaginal discharge with irritation x 3 days.  Stopped Micronor.  LMP 04/01/21 was normal.  No BTB.  Fibroids on last Pelvic US 11/2020.  No pelvic pain.  No fever.  Urine/BMs normal.   OB History  Gravida Para Term Preterm AB Living  3 1     2 1   SAB IAB Ectopic Multiple Live Births    2          # Outcome Date GA Lbr Len/2nd Weight Sex Delivery Anes PTL Lv  3 IAB           2 IAB           1 Para             Past medical history,surgical history, problem list, medications, allergies, family history and social history were all reviewed and documented in the EPIC chart.   Directed ROS with pertinent positives and negatives documented in the history of present illness/assessment and plan.  Exam:  There were no vitals filed for this visit. General appearance:  Normal  Abdomen: Normal.  Gynecologic exam: Vulva normal.  Speculum:  Cervix/vagina normal.  Increased discharge.  Wet prep done.  Gono-Chlam done.  U/A: Yellow cloudy, protein negative, nitrites negative, white blood cells 20-40, red blood cells negative, bacteria moderate.  Few trichomonas.  Wet prep:  Trichomonas present.  Clue cells present.  Odor positive.   Assessment/Plan:  45 y.o. [id number]   1. Vaginal itching Vaginal trichomonas infection confirmed by wet prep.  Patient informed that this is an STI.  We will do a full STI screening.  Will treat with metronidazole 500 mg per mouth twice a day for 7 days.  Partner needs to be treated.  Strict condom use after treatment is completed.  Patient will follow up for a test of cure. - WET PREP FOR [place], YEAST, CLUE  2. Frequency of urination Trichomonas on urine analysis.  Pending urine culture. - Urinalysis,Complete w/RFL Culture  3. Trichomonal infection As above.  Will treat with metronidazole per mouth.  4. Screen for  STD (sexually transmitted disease) Strict condom use. - C. trachomatis/N. gonorrhoeae RNA - HIV antibody (with reflex); Future - RPR; Future - Hepatitis B Surface AntiGEN; Future - Hepatitis C Antibody; Future  Other orders - metroNIDAZOLE (FLAGYL) 500 MG tablet; Take 1 tablet (500 mg total) by mouth 2 (two) times daily for 7 days.  Princess Bruins MD, 2:05 PM 04/19/2021

## 2021-04-21 LAB — URINE CULTURE
MICRO NUMBER:: 11859791
SPECIMEN QUALITY:: ADEQUATE

## 2021-04-21 LAB — URINALYSIS, COMPLETE W/RFL CULTURE
Bilirubin Urine: NEGATIVE
Glucose, UA: NEGATIVE
Hgb urine dipstick: NEGATIVE
Hyaline Cast: NONE SEEN /LPF
Ketones, ur: NEGATIVE
Nitrites, Initial: NEGATIVE
Protein, ur: NEGATIVE
RBC / HPF: NONE SEEN /HPF (ref 0–2)
Specific Gravity, Urine: 1.015 (ref 1.001–1.035)
pH: 7 (ref 5.0–8.0)

## 2021-04-21 LAB — CULTURE INDICATED

## 2021-04-22 ENCOUNTER — Ambulatory Visit: Payer: BC Managed Care – PPO | Admitting: Family Medicine

## 2021-04-22 LAB — C. TRACHOMATIS/N. GONORRHOEAE RNA
C. trachomatis RNA, TMA: NOT DETECTED
N. gonorrhoeae RNA, TMA: NOT DETECTED

## 2021-04-23 ENCOUNTER — Encounter: Payer: Self-pay | Admitting: Obstetrics & Gynecology

## 2021-04-24 ENCOUNTER — Other Ambulatory Visit: Payer: Self-pay

## 2021-04-24 NOTE — Telephone Encounter (Signed)
Last AEX 03/05/20 Scheduled with Dr. Marguerita Merles for AEX 05/27/21

## 2021-04-25 MED ORDER — NONFORMULARY OR COMPOUNDED ITEM: 1 refills | Status: DC

## 2021-05-10 ENCOUNTER — Ambulatory Visit: Payer: BC Managed Care – PPO | Admitting: Nurse Practitioner

## 2021-05-10 ENCOUNTER — Other Ambulatory Visit: Payer: Self-pay

## 2021-05-10 ENCOUNTER — Encounter: Payer: Self-pay | Admitting: Nurse Practitioner

## 2021-05-10 VITALS — BP 140/92 | HR 82 | Temp 98.8°F | Resp 20

## 2021-05-10 DIAGNOSIS — N898 Other specified noninflammatory disorders of vagina: Secondary | ICD-10-CM | POA: Diagnosis not present

## 2021-05-10 DIAGNOSIS — B373 Candidiasis of vulva and vagina: Secondary | ICD-10-CM

## 2021-05-10 DIAGNOSIS — B3731 Acute candidiasis of vulva and vagina: Secondary | ICD-10-CM

## 2021-05-10 LAB — WET PREP FOR TRICH, YEAST, CLUE

## 2021-05-10 MED ORDER — FLUCONAZOLE 150 MG PO TABS: ORAL_TABLET | ORAL | 0 refills | Status: DC
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
## 2021-05-10 NOTE — Patient Instructions (Signed)
Vaginal Yeast Infection, Adult  Vaginal yeast infection is a condition that causes vaginal discharge as well as soreness, swelling, and redness (inflammation) of the vagina. This is a common condition. Some women get this infection frequently. What are the causes? This condition is caused by a change in the normal balance of the yeast (candida) and bacteria that live in the vagina. This change causes an overgrowth of yeast, which causes the inflammation. What increases the risk? The condition is more likely to develop in women who:  Take antibiotic medicines.  Have diabetes.  Take birth control pills.  Are pregnant.  Douche often.  Have a weak body defense system (immune system).  Have been taking steroid medicines for a long time.  Frequently wear tight clothing. What are the signs or symptoms? Symptoms of this condition include:  White, thick, creamy vaginal discharge.  Swelling, itching, redness, and irritation of the vagina. The lips of the vagina (vulva) may be affected as well.  Pain or a burning feeling while urinating.  Pain during sex. How is this diagnosed? This condition is diagnosed based on:  Your medical history.  A physical exam.  A pelvic exam. Your health care provider will examine a sample of your vaginal discharge under a microscope. Your health care provider may send this sample for testing to confirm the diagnosis. How is this treated? This condition is treated with medicine. Medicines may be over-the-counter or prescription. You may be told to use one or more of the following:  Medicine that is taken by mouth (orally).  Medicine that is applied as a cream (topically).  Medicine that is inserted directly into the vagina (suppository). Follow these instructions at home: Lifestyle  Do not have sex until your health care provider approves. Tell your sex partner that you have a yeast infection. That person should go to his or her health care  provider and ask if they should also be treated.  Do not wear tight clothes, such as pantyhose or tight pants.  Wear breathable cotton underwear. General instructions  Take or apply over-the-counter and prescription medicines only as told by your health care provider.  Eat more yogurt. This may help to keep your yeast infection from returning.  Do not use tampons until your health care provider approves.  Try taking a sitz bath to help with discomfort. This is a warm water bath that is taken while you are sitting down. The water should only come up to your hips and should cover your buttocks. Do this 3-4 times per day or as told by your health care provider.  Do not douche.  If you have diabetes, keep your blood sugar levels under control.  Keep all follow-up visits as told by your health care provider. This is important.   Contact a health care provider if:  You have a fever.  Your symptoms go away and then return.  Your symptoms do not get better with treatment.  Your symptoms get worse.  You have new symptoms.  You develop blisters in or around your vagina.  You have blood coming from your vagina and it is not your menstrual period.  You develop pain in your abdomen. Summary  Vaginal yeast infection is a condition that causes discharge as well as soreness, swelling, and redness (inflammation) of the vagina.  This condition is treated with medicine. Medicines may be over-the-counter or prescription.  Take or apply over-the-counter and prescription medicines only as told by your health care provider.  Do not   douche. Do not have sex or use tampons until your health care provider approves.  Contact a health care provider if your symptoms do not get better with treatment or your symptoms go away and then return. This information is not intended to replace advice given to you by your health care provider. Make sure you discuss any questions you have with your health care  provider. Document Revised: 07/01/2019 Document Reviewed: 04/19/2018 Elsevier Patient Education  2021 [employer]  

## 2021-05-27 ENCOUNTER — Ambulatory Visit: Payer: BC Managed Care – PPO | Admitting: Obstetrics & Gynecology

## 2021-05-27 DIAGNOSIS — Z0289 Encounter for other administrative examinations: Secondary | ICD-10-CM

## 2021-09-29 DIAGNOSIS — E785 Hyperlipidemia, unspecified: Secondary | ICD-10-CM | POA: Insufficient documentation
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
## 2021-09-30 ENCOUNTER — Other Ambulatory Visit: Payer: Self-pay

## 2021-09-30 ENCOUNTER — Encounter: Payer: Self-pay | Admitting: Family Medicine

## 2021-09-30 ENCOUNTER — Ambulatory Visit (INDEPENDENT_AMBULATORY_CARE_PROVIDER_SITE_OTHER): Payer: BC Managed Care – PPO | Admitting: Family Medicine

## 2021-09-30 VITALS — BP 150/90 | HR 82 | Temp 98.9°F | Resp 16 | Ht 64.0 in | Wt 232.0 lb

## 2021-09-30 DIAGNOSIS — E559 Vitamin D deficiency, unspecified: Secondary | ICD-10-CM | POA: Diagnosis not present

## 2021-09-30 DIAGNOSIS — Z0000 Encounter for general adult medical examination without abnormal findings: Secondary | ICD-10-CM

## 2021-09-30 DIAGNOSIS — E785 Hyperlipidemia, unspecified: Secondary | ICD-10-CM | POA: Diagnosis not present

## 2021-09-30 DIAGNOSIS — Z1329 Encounter for screening for other suspected endocrine disorder: Secondary | ICD-10-CM

## 2021-09-30 DIAGNOSIS — Z130 Encounter for screening for diseases of the blood and blood-forming organs and certain disorders involving the immune mechanism: Secondary | ICD-10-CM | POA: Diagnosis not present

## 2021-09-30 DIAGNOSIS — Z13228 Encounter for screening for other metabolic disorders: Secondary | ICD-10-CM | POA: Diagnosis not present

## 2021-09-30 DIAGNOSIS — I10 Essential (primary) hypertension: Secondary | ICD-10-CM

## 2021-09-30 DIAGNOSIS — F330 Major depressive disorder, recurrent, mild: Secondary | ICD-10-CM | POA: Insufficient documentation

## 2021-09-30 LAB — CBC WITH DIFFERENTIAL/PLATELET
Basophils Absolute: 0 10*3/uL (ref 0.0–0.1)
Basophils Relative: 0.4 % (ref 0.0–3.0)
Eosinophils Absolute: 0 10*3/uL (ref 0.0–0.7)
Eosinophils Relative: 0.7 % (ref 0.0–5.0)
HCT: 34.5 % — ABNORMAL LOW (ref 36.0–46.0)
Hemoglobin: 10.9 g/dL — ABNORMAL LOW (ref 12.0–15.0)
Lymphocytes Relative: 33 % (ref 12.0–46.0)
Lymphs Abs: 2 10*3/uL (ref 0.7–4.0)
MCHC: 31.5 g/dL (ref 30.0–36.0)
MCV: 73.9 fl — ABNORMAL LOW (ref 78.0–100.0)
Monocytes Absolute: 0.3 10*3/uL (ref 0.1–1.0)
Monocytes Relative: 5.6 % (ref 3.0–12.0)
Neutro Abs: 3.7 10*3/uL (ref 1.4–7.7)
Neutrophils Relative %: 60.3 % (ref 43.0–77.0)
Platelets: 460 10*3/uL — ABNORMAL HIGH (ref 150.0–400.0)
RBC: 4.67 Mil/uL (ref 3.87–5.11)
RDW: 15.8 % — ABNORMAL HIGH (ref 11.5–15.5)
WBC: 6.2 10*3/uL (ref 4.0–10.5)

## 2021-09-30 LAB — LIPID PANEL
Cholesterol: 192 mg/dL (ref 0–200)
HDL: 64.6 mg/dL (ref >39.00)
LDL Cholesterol: 117 mg/dL — ABNORMAL HIGH (ref 0–99)
NonHDL: 127.51
Total CHOL/HDL Ratio: 3
Triglycerides: 53 mg/dL (ref 0.0–149.0)
VLDL: 10.6 mg/dL (ref 0.0–40.0)

## 2021-09-30 LAB — COMPREHENSIVE METABOLIC PANEL
ALT: 12 U/L (ref 0–35)
AST: 16 U/L (ref 0–37)
Albumin: 4.3 g/dL (ref 3.5–5.2)
Alkaline Phosphatase: 77 U/L (ref 39–117)
BUN: 8 mg/dL (ref 6–23)
CO2: 27 mEq/L (ref 19–32)
Calcium: 9.2 mg/dL (ref 8.4–10.5)
Chloride: 103 mEq/L (ref 96–112)
Creatinine, Ser: 0.64 mg/dL (ref 0.40–1.20)
GFR: 107.08 mL/min (ref >60.00)
Glucose, Bld: 94 mg/dL (ref 70–99)
Potassium: 3.5 mEq/L (ref 3.5–5.1)
Sodium: 137 mEq/L (ref 135–145)
Total Bilirubin: 0.6 mg/dL (ref 0.2–1.2)
Total Protein: 7.9 g/dL (ref 6.0–8.3)

## 2021-09-30 LAB — VITAMIN D 25 HYDROXY (VIT D DEFICIENCY, FRACTURES): VITD: 20 ng/mL — ABNORMAL LOW (ref 30.00–100.00)

## 2021-09-30 MED ORDER — AMLODIPINE BESYLATE 5 MG PO TABS: 5.0000 mg | ORAL_TABLET | Freq: Every day | ORAL | Status: DC

## 2021-09-30 NOTE — Assessment & Plan Note (Signed)
OTC Vit D 1000 U recommended, further recommendations according to 25 OH vit D results.

## 2021-09-30 NOTE — Assessment & Plan Note (Addendum)
BMI 39.8 Comorbidities GERD, hypertension, and depression. We discussed benefits of wt loss as well as adverse effects of obesity. Consistency with healthy diet and physical activity recommended.

## 2021-09-30 NOTE — Assessment & Plan Note (Signed)
Re-checked 170/100. We discussed possible complications of elevated BP. Amlodipine 5 mg at bedtime recommended. DASH/low salt diet. Monitor BP at home. Instructed about warning signs. F/U in 5 weeks, before if needed.

## 2021-09-30 NOTE — Assessment & Plan Note (Signed)
She would like to try counseling first, information about lab our healthcare behavioral medicine given. Follow-up in 4 to 5 weeks, before if needed.

## 2021-09-30 NOTE — Assessment & Plan Note (Signed)
Non pharmacologic treatment recommended for now. Further recommendations will be given according to 10 years CVD risk score and lipid panel numbers. 

## 2021-09-30 NOTE — Patient Instructions (Addendum)
A few things to remember from today's visit:  Vitamin D deficiency, unspecified - Plan: VITAMIN D 25 Hydroxy (Vit-D Deficiency, Fractures)  Hyperlipidemia, unspecified hyperlipidemia type - Plan: Lipid panel  Essential hypertension - Plan: CBC with Differential/Platelet  Routine general medical examination at a health care facility  Screening for endocrine, metabolic and immunity disorder - Plan: Comprehensive metabolic panel  Start vit D 1000 U daily. Today we are starting Amlodipine 5 mg at bedtime. Monitor blood pressure at home.  If you need refills please call your pharmacy. Do not use My Chart to request refills or for acute issues that need immediate attention.   Please be sure medication list is accurate. If a new problem present, please set up appointment sooner than planned today.  Health Maintenance, Female Adopting a healthy lifestyle and getting preventive care are important in promoting health and wellness. Ask your health care provider about: The right schedule for you to have regular tests and exams. Things you can do on your own to prevent diseases and keep yourself healthy. What should I know about diet, weight, and exercise? Eat a healthy diet  Eat a diet that includes plenty of vegetables, fruits, low-fat dairy products, and lean protein. Do not eat a lot of foods that are high in solid fats, added sugars, or sodium. Maintain a healthy weight Body mass index (BMI) is used to identify weight problems. It estimates body fat based on height and weight. Your health care provider can help determine your BMI and help you achieve or maintain a healthy weight. Get regular exercise Get regular exercise. This is one of the most important things you can do for your health. Most adults should: Exercise for at least 150 minutes each week. The exercise should increase your heart rate and make you sweat (moderate-intensity exercise). Do strengthening exercises at least twice  a week. This is in addition to the moderate-intensity exercise. Spend less time sitting. Even light physical activity can be beneficial. Watch cholesterol and blood lipids Have your blood tested for lipids and cholesterol at 45 years of age, then have this test every 5 years. Have your cholesterol levels checked more often if: Your lipid or cholesterol levels are high. You are older than 45 years of age. You are at high risk for heart disease. What should I know about cancer screening? Depending on your health history and family history, you may need to have cancer screening at various ages. This may include screening for: Breast cancer. Cervical cancer. Colorectal cancer. Skin cancer. Lung cancer. What should I know about heart disease, diabetes, and high blood pressure? Blood pressure and heart disease High blood pressure causes heart disease and increases the risk of stroke. This is more likely to develop in people who have high blood pressure readings, are of African descent, or are overweight. Have your blood pressure checked: Every 3-5 years if you are 102-25 years of age. Every year if you are 58 years old or older. Diabetes Have regular diabetes screenings. This checks your fasting blood sugar level. Have the screening done: Once every three years after age 65 if you are at a normal weight and have a low risk for diabetes. More often and at a younger age if you are overweight or have a high risk for diabetes. What should I know about preventing infection? Hepatitis B If you have a higher risk for hepatitis B, you should be screened for this virus. Talk with your health care provider to find out if  you are at risk for hepatitis B infection. Hepatitis C Testing is recommended for: Everyone born from [birth year] through 1965. Anyone with known risk factors for hepatitis C. Sexually transmitted infections (STIs) Get screened for STIs, including gonorrhea and chlamydia, if: You are  sexually active and are younger than 45 years of age. You are older than 45 years of age and your health care provider tells you that you are at risk for this type of infection. Your sexual activity has changed since you were last screened, and you are at increased risk for chlamydia or gonorrhea. Ask your health care provider if you are at risk. Ask your health care provider about whether you are at high risk for HIV. Your health care provider may recommend a prescription medicine to help prevent HIV infection. If you choose to take medicine to prevent HIV, you should first get tested for HIV. You should then be tested every 3 months for as long as you are taking the medicine. Pregnancy If you are about to stop having your period (premenopausal) and you may become pregnant, seek counseling before you get pregnant. Take 400 to 800 micrograms (mcg) of folic acid every day if you become pregnant. Ask for birth control (contraception) if you want to prevent pregnancy. Osteoporosis and menopause Osteoporosis is a disease in which the bones lose minerals and strength with aging. This can result in bone fractures. If you are 34 years old or older, or if you are at risk for osteoporosis and fractures, ask your health care provider if you should: Be screened for bone loss. Take a calcium or vitamin D supplement to lower your risk of fractures. Be given hormone replacement therapy (HRT) to treat symptoms of menopause. Follow these instructions at home: Lifestyle Do not use any products that contain nicotine or tobacco, such as cigarettes, e-cigarettes, and chewing tobacco. If you need help quitting, ask your health care provider. Do not use street drugs. Do not share needles. Ask your health care provider for help if you need support or information about quitting drugs. Alcohol use Do not drink alcohol if: Your health care provider tells you not to drink. You are pregnant, may be pregnant, or are  planning to become pregnant. If you drink alcohol: Limit how much you use to 0-1 drink a day. Limit intake if you are breastfeeding. Be aware of how much alcohol is in your drink. In the [place] one drink equals one 12 oz bottle of beer (355 mL), one 5 oz glass of wine (148 mL), or one 1 oz glass of hard liquor (44 mL). General instructions Schedule regular health, dental, and eye exams. Stay current with your vaccines. Tell your health care provider if: You often feel depressed. You have ever been abused or do not feel safe at home. Summary Adopting a healthy lifestyle and getting preventive care are important in promoting health and wellness. Follow your health care provider's instructions about healthy diet, exercising, and getting tested or screened for diseases. Follow your health care provider's instructions on monitoring your cholesterol and blood pressure. This information is not intended to replace advice given to you by your health care provider. Make sure you discuss any questions you have with your health care provider. Document Revised: 02/08/2021 Document Reviewed: 11/24/2018 Elsevier Patient Education  2022 [employer].

## 2021-10-04 ENCOUNTER — Other Ambulatory Visit: Payer: Self-pay

## 2021-10-04 DIAGNOSIS — D649 Anemia, unspecified: Secondary | ICD-10-CM

## 2021-10-04 DIAGNOSIS — Z1211 Encounter for screening for malignant neoplasm of colon: Secondary | ICD-10-CM

## 2021-11-01 ENCOUNTER — Ambulatory Visit (INDEPENDENT_AMBULATORY_CARE_PROVIDER_SITE_OTHER): Payer: BC Managed Care – PPO | Admitting: Psychology

## 2021-11-01 DIAGNOSIS — F4310 Post-traumatic stress disorder, unspecified: Secondary | ICD-10-CM

## 2021-11-20 ENCOUNTER — Other Ambulatory Visit: Payer: Self-pay | Admitting: Family Medicine

## 2021-11-20 DIAGNOSIS — Z1231 Encounter for screening mammogram for malignant neoplasm of breast: Secondary | ICD-10-CM

## 2021-12-10 ENCOUNTER — Ambulatory Visit: Payer: Self-pay | Admitting: Psychology

## 2021-12-12 ENCOUNTER — Telehealth (INDEPENDENT_AMBULATORY_CARE_PROVIDER_SITE_OTHER): Payer: BC Managed Care – PPO | Admitting: Adult Health

## 2021-12-12 DIAGNOSIS — R6889 Other general symptoms and signs: Secondary | ICD-10-CM | POA: Diagnosis not present

## 2021-12-12 MED ORDER — OSELTAMIVIR PHOSPHATE 75 MG PO CAPS: 75.0000 mg | ORAL_CAPSULE | Freq: Two times a day (BID) | ORAL | 0 refills | Status: AC

## 2021-12-12 NOTE — Progress Notes (Signed)
Virtual Visit via Telephone Note  I connected with Lynn Hansen on 12/12/21 at  7:00 AM EST by telephone and verified that I am speaking with the correct person using two identifiers.   I discussed the limitations, risks, security and privacy concerns of performing an evaluation and management service by telephone and the availability of in person appointments. I also discussed with the patient that there may be a patient responsible charge related to this service. The patient expressed understanding and agreed to proceed.  Location patient: home Location provider: work or home office Participants present for the call: patient, provider Patient did not have a visit in the prior 7 days to address this/these issue(s).   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  She is being evaluated today for an acute issue. Her symptoms started two days ago. Symptoms include headache,chills, fatigue,  body aches, mildly productive cough. She denies fevers, sore throat, or shortness of breath.   She has been using Nyquil and coricidin hbp, and sudafed which helped with her symptoms  No other sick contacts.   She did not test herself for COVID  She did not get a flu shot    Observations/Objective: Patient sounds cheerful and well on the phone. I do not appreciate any SOB. Dry cough noted.  Speech and thought processing are grossly intact. Patient reported vitals:  Assessment and Plan: 1. Flu-like symptoms - will treat for influenza due to symptoms.  Advised OTC such as motrin/Tylenol for symptom relief, rest, and stay well hydrated. Follow up if symptoms worsen.  - oseltamivir (TAMIFLU) 75 MG capsule; Take 1 capsule (75 mg total) by mouth 2 (two) times daily for 5 days.  Dispense: 10 capsule; Refill: 0   Follow Up Instructions:  I did not refer this patient for an OV in the next 24 hours for this/these issue(s).  I discussed the assessment and treatment plan with the patient. The patient was provided an opportunity to ask questions and all were answered. The patient agreed with the plan and demonstrated an understanding of the instructions.   The patient was advised to call back or seek an in-person evaluation if the symptoms worsen or if the condition fails to improve as anticipated.  I provided 15 minutes of non-face-to-face time during this encounter.   Lynn Peng, NP

## 2021-12-19 ENCOUNTER — Ambulatory Visit: Payer: BC Managed Care – PPO

## 2021-12-24 ENCOUNTER — Ambulatory Visit: Payer: BC Managed Care – PPO | Admitting: Psychology

## 2022-01-03 ENCOUNTER — Ambulatory Visit: Payer: BC Managed Care – PPO | Admitting: Family Medicine

## 2022-01-03 ENCOUNTER — Encounter: Payer: Self-pay | Admitting: Family Medicine

## 2022-01-03 VITALS — BP 130/80 | HR 90 | Resp 16 | Ht 64.0 in | Wt 239.0 lb

## 2022-01-03 DIAGNOSIS — J4520 Mild intermittent asthma, uncomplicated: Secondary | ICD-10-CM | POA: Diagnosis not present

## 2022-01-03 DIAGNOSIS — J309 Allergic rhinitis, unspecified: Secondary | ICD-10-CM | POA: Diagnosis not present

## 2022-01-03 DIAGNOSIS — R222 Localized swelling, mass and lump, trunk: Secondary | ICD-10-CM | POA: Diagnosis not present

## 2022-01-03 DIAGNOSIS — I10 Essential (primary) hypertension: Secondary | ICD-10-CM

## 2022-01-03 MED ORDER — AMLODIPINE BESYLATE 5 MG PO TABS: 5.0000 mg | ORAL_TABLET | Freq: Every day | ORAL | 1 refills | Status: DC

## 2022-01-03 MED ORDER — BUDESONIDE 90 MCG/ACT IN AEPB: 2.0000 | INHALATION_SPRAY | Freq: Two times a day (BID) | RESPIRATORY_TRACT | 1 refills | Status: DC

## 2022-01-03 MED ORDER — FLUTICASONE PROPIONATE 50 MCG/ACT NA SUSP: 1.0000 | Freq: Two times a day (BID) | NASAL | 3 refills | Status: AC

## 2022-01-03 NOTE — Progress Notes (Signed)
ACUTE VISIT Chief Complaint  Patient presents with   knot near breast    Noticed about a week ago.   HPI: [name] is a 46 y.o. female, who is here today complaining of "knot" she can palpate beneath skin under left breast as described above. Bilateral pigmentation changes, chronic, no other skin changes on area of concerns. It was painful for a few days. She has not tried OTC medications. Size seems to be stable.  She is also c/o persistent cough. She does "cough all the time." Nasal congestion, rhinorrhea,and post nasal drainage. "Little" wheezing several times per week. Negative for CP,SOB,or palpitations.  Symptoms seem to be exacerbated by heat. No heartburn,nausea,or abdominal pain. Asthma: She has not used Albuterol inh, expired.  Planning on starting exercising, has not done sone since 10/2021 when she had flu. She is thinking about starting wt watchers.  She was last seen on 09/30/21, when Amlodipine 5 mg was started. BP has been lower than today's blood pressure. Negative for severe/frequent headache, visual changes,focal weakness, or edema.  Review of Systems  Constitutional:  Negative for activity change, appetite change and fever.  HENT:  Negative for mouth sores, nosebleeds and sore throat.   Gastrointestinal:  Negative for vomiting.       Negative for changes in bowel habits.  Genitourinary:  Negative for decreased urine volume and hematuria.  Musculoskeletal:  Negative for gait problem and myalgias.  Allergic/Immunologic: Positive for environmental allergies.  Neurological:  Negative for syncope, facial asymmetry and weakness.  Rest see pertinent positives and negatives per HPI.  No current outpatient medications on file prior to visit.   No current facility-administered medications on file prior to visit.   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   HSV (herpes simplex virus) infection    Hypertension    Lactose intolerance    Migraines    Pre-diabetes    Sebaceous cyst of ear    right ear   No Known Allergies  Social History   Socioeconomic History   Marital status: Single    Spouse name: Not on file   Number of children: 1   Years of education: Not on file   Highest education level: Not on file  Occupational History   Occupation: Education officer, museum  Tobacco Use   Smoking status: Never   Smokeless tobacco: Never  Vaping Use   Vaping Use: Never used  Substance and Sexual Activity   Alcohol use: Not Currently   Drug use: No   Sexual activity: Yes    Partners: Male  Other Topics Concern   Not on file  Social History Narrative   Not on file   Social Determinants of Health   Financial Resource Strain: Not on file  Food Insecurity: Not on file  Transportation Needs: Not on file  Physical Activity: Not on file  Stress: Not on file  Social Connections: Not on file   Vitals:   01/03/22 1456  BP: 130/80  Pulse: 90  Resp: 16  SpO2: 97%   Wt Readings  from Last 3 Encounters:  01/03/22 239 lb (108.4 kg)  09/30/21 232 lb (105.2 kg)  12/10/20 221 lb (100.2 kg)   Body mass index is 41.02 kg/m.  Physical Exam Vitals and nursing note reviewed.  Constitutional:      General: She is not in acute distress.    Appearance: She is well-developed.  HENT:     Head: Normocephalic and atraumatic.     Nose:     Right Turbinates: Enlarged.     Left Turbinates: Enlarged.     Mouth/Throat:     Mouth: Mucous membranes are moist.     Pharynx: Oropharynx is clear.  Eyes:     Conjunctiva/sclera: Conjunctivae normal.  Cardiovascular:     Rate and  Rhythm: Normal rate and regular rhythm.     Pulses:          Dorsalis pedis pulses are 2+ on the right side and 2+ on the left side.     Heart sounds: No murmur heard. Pulmonary:     Effort: Pulmonary effort is normal. No respiratory distress.     Breath sounds: Normal breath sounds.     Comments: Coughing a few times during visit. Chest:       Comments: About 5-6 cm under left breast a nodular lesion , 1.5-2 cm, mobile, not tender, defined borders. No skin changes. Abdominal:     Palpations: Abdomen is soft. There is no hepatomegaly or mass.     Tenderness: There is no abdominal tenderness.  Lymphadenopathy:     Cervical: No cervical adenopathy.  Skin:    General: Skin is warm.     Findings: No erythema or rash.  Neurological:     General: No focal deficit present.     Mental Status: She is alert and oriented to person, place, and time.     Cranial Nerves: No cranial nerve deficit.     Gait: Gait normal.  Psychiatric:     Comments: Well groomed, good eye contact.   ASSESSMENT AND PLAN:  [name] was seen today for knot near breast.  Diagnoses and all orders for this visit:  Allergic rhinitis, unspecified seasonality, unspecified trigger Nasal saline irrigations as needed. Flonase nasal s[ray daily as needed, some side effects discussed.  -     fluticasone (FLONASE) 50 MCG/ACT nasal spray; Place 1 spray into both nostrils 2 (two) times daily.  Mass in chest Palpable beneath skin under left breast. Reassured, most likely benig, ? Lipoma vs sebaceous cyst. She agrees with continuing monitoring for changes.  Essential hypertension BP adequately controlled. Reporting taking Amlodipine daily even though last refill was in 09/2021. Continue same treatment, Rx sent. Continue monitoring BP regularly. Low salt diet recommended and wt loss will also help.  Asthma, intermittent, uncomplicated Problem is not well controlled. Pulmicort 90 mcg 2 puff bid added today. Some  side effects discussed, instructed to swish after use. Continue Albuterol inh 2 puff q 4-6 hours prn.     Morbid obesity ([place]) We discussed benefits of wt loss as well as adverse effects of obesity. Consistency with healthy diet and physical activity recommended..  Return in about 2 months (around 03/03/2022).  Lynn Hansen G. Martinique, MD  [facility]. [place] office.

## 2022-01-03 NOTE — Assessment & Plan Note (Addendum)
BP adequately controlled. Reporting taking Amlodipine daily even though last refill was in 09/2021. Continue same treatment, Rx sent. Continue monitoring BP regularly. Low salt diet recommended and wt loss will also help.

## 2022-01-03 NOTE — Assessment & Plan Note (Signed)
We discussed benefits of wt loss as well as adverse effects of obesity. Consistency with healthy diet and physical activity recommended.  

## 2022-01-03 NOTE — Assessment & Plan Note (Addendum)
Problem is not well controlled. Pulmicort 90 mcg 2 puff bid added today. Some side effects discussed, instructed to swish after use. Continue Albuterol inh 2 puff q 4-6 hours prn.

## 2022-01-03 NOTE — Patient Instructions (Addendum)
A few things to remember from today's visit:  Asthma, intermittent, uncomplicated - Plan: Budesonide 90 MCG/ACT inhaler  Allergic rhinitis, unspecified seasonality, unspecified trigger - Plan: fluticasone (FLONASE) 50 MCG/ACT nasal spray  Mass in chest Continue monitoring for changes. ? Lipoma.  Pulmicort added today for asthma. Rinse mouth after use. Continue Albuterol inh 2 puff every 4-6 hours as needed.  If you need refills please call your pharmacy. Do not use My Chart to request refills or for acute issues that need immediate attention.    Please be sure medication list is accurate. If a new problem present, please set up appointment sooner than planned today.

## 2022-01-05 MED ORDER — ALBUTEROL SULFATE HFA 108 (90 BASE) MCG/ACT IN AERS: 2.0000 | INHALATION_SPRAY | Freq: Four times a day (QID) | RESPIRATORY_TRACT | 0 refills | Status: DC | PRN

## 2022-01-07 ENCOUNTER — Telehealth: Payer: Self-pay | Admitting: Family Medicine

## 2022-01-07 NOTE — Telephone Encounter (Signed)
Contacted patient to schedule 2 month follow up per Dr.Jordan

## 2022-01-13 ENCOUNTER — Encounter: Payer: BC Managed Care – PPO | Admitting: Psychology

## 2022-01-13 NOTE — Progress Notes (Signed)
This encounter was created in error - please disregard.

## 2022-06-13 NOTE — Progress Notes (Unsigned)
    ACUTE VISIT No chief complaint on file.  HPI: Lynn Hansen is a 46 y.o. female, who is here today complaining of *** HPI  Review of Systems Rest see pertinent positives and negatives per HPI.  Current Outpatient Medications on File Prior to Visit  Medication Sig Dispense Refill  . albuterol (VENTOLIN HFA) 108 (90 Base) MCG/ACT inhaler Inhale 2 puffs into the lungs every 6 (six) hours as needed for wheezing or shortness of breath. 8 g 0  . amLODipine (NORVASC) 5 MG tablet Take 1 tablet (5 mg total) by mouth daily. 90 tablet 1  . Budesonide 90 MCG/ACT inhaler Inhale 2 puffs into the lungs 2 (two) times daily. 1 each 1  . fluticasone (FLONASE) 50 MCG/ACT nasal spray Place 1 spray into both nostrils 2 (two) times daily. 16 g 3   No current facility-administered medications on file prior to visit.     Past Medical History:  Diagnosis Date  . Allergies   . Asthma   . Back pain   . Blood transfusion without reported diagnosis   . Cardiomegaly   . 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 Constipation   . Depression   . Fibroid   . Foot pain   . 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 HSV (herpes simplex virus) infection   . Hypertension   . Lactose intolerance   . Migraines   . Pre-diabetes   . Sebaceous cyst of ear    right ear   No Known Allergies  Social History   Socioeconomic History  . Marital status: Single    Spouse name: Not on file  . Number of children: 1  . Years of education: Not on file  . Highest education level: Not on file  Occupational History  . Occupation: Education officer, museum  Tobacco Use  . Smoking status: Never  . Smokeless tobacco: Never  Vaping Use  . Vaping Use: Never used  Substance and Sexual Activity  . Alcohol use: Not Currently  . Drug use: No  . Sexual activity: Yes    Partners: Male  Other  Topics Concern  . Not on file  Social History Narrative  . Not on file   Social Determinants of Health   Financial Resource Strain: Not on file  Food Insecurity: Not on file  Transportation Needs: Not on file  Physical Activity: Not on file  Stress: Not on file  Social Connections: Not on file    There were no vitals filed for this visit. There is no height or weight on file to calculate BMI.  Physical Exam  ASSESSMENT AND PLAN:  There are no diagnoses linked to this encounter.   No follow-ups on file.   Bernardo Brayman G. Martinique, MD  [facility]. [place] office.  Discharge Instructions   None

## 2022-06-16 ENCOUNTER — Encounter: Payer: Self-pay | Admitting: Family Medicine

## 2022-06-16 ENCOUNTER — Ambulatory Visit: Payer: BC Managed Care – PPO | Admitting: Family Medicine

## 2022-06-16 VITALS — BP 128/80 | HR 85 | Resp 12 | Ht 64.0 in | Wt 243.5 lb

## 2022-06-16 DIAGNOSIS — R0602 Shortness of breath: Secondary | ICD-10-CM | POA: Diagnosis not present

## 2022-06-16 DIAGNOSIS — D509 Iron deficiency anemia, unspecified: Secondary | ICD-10-CM | POA: Diagnosis not present

## 2022-06-16 DIAGNOSIS — R600 Localized edema: Secondary | ICD-10-CM | POA: Diagnosis not present

## 2022-06-16 DIAGNOSIS — M25561 Pain in right knee: Secondary | ICD-10-CM

## 2022-06-16 DIAGNOSIS — R7989 Other specified abnormal findings of blood chemistry: Secondary | ICD-10-CM

## 2022-06-16 DIAGNOSIS — Z1211 Encounter for screening for malignant neoplasm of colon: Secondary | ICD-10-CM

## 2022-06-16 DIAGNOSIS — J4520 Mild intermittent asthma, uncomplicated: Secondary | ICD-10-CM

## 2022-06-16 DIAGNOSIS — G8929 Other chronic pain: Secondary | ICD-10-CM

## 2022-06-16 LAB — CBC
HCT: 33.3 % — ABNORMAL LOW (ref 36.0–46.0)
Hemoglobin: 10.6 g/dL — ABNORMAL LOW (ref 12.0–15.0)
MCHC: 31.8 g/dL (ref 30.0–36.0)
MCV: 73.2 fl — ABNORMAL LOW (ref 78.0–100.0)
Platelets: 452 10*3/uL — ABNORMAL HIGH (ref 150.0–400.0)
RBC: 4.55 Mil/uL (ref 3.87–5.11)
RDW: 18.3 % — ABNORMAL HIGH (ref 11.5–15.5)
WBC: 6.7 10*3/uL (ref 4.0–10.5)

## 2022-06-16 LAB — BASIC METABOLIC PANEL
BUN: 7 mg/dL (ref 6–23)
CO2: 28 mEq/L (ref 19–32)
Calcium: 8.9 mg/dL (ref 8.4–10.5)
Chloride: 101 mEq/L (ref 96–112)
Creatinine, Ser: 0.54 mg/dL (ref 0.40–1.20)
GFR: 111 mL/min (ref >60.00)
Glucose, Bld: 89 mg/dL (ref 70–99)
Potassium: 3.6 mEq/L (ref 3.5–5.1)
Sodium: 137 mEq/L (ref 135–145)

## 2022-06-16 LAB — IRON: Iron: 22 ug/dL — ABNORMAL LOW (ref 42–145)

## 2022-06-16 LAB — D-DIMER, QUANTITATIVE: D-Dimer, Quant: 0.52 mcg/mL FEU — ABNORMAL HIGH (ref <0.50)

## 2022-06-16 LAB — FERRITIN: Ferritin: 8.8 ng/mL — ABNORMAL LOW (ref 10.0–291.0)

## 2022-06-16 MED ORDER — FLUTICASONE-SALMETEROL 100-50 MCG/ACT IN AEPB: 1.0000 | INHALATION_SPRAY | Freq: Two times a day (BID) | RESPIRATORY_TRACT | 1 refills | Status: DC

## 2022-06-16 MED ORDER — FLUTICASONE-SALMETEROL 100-50 MCG/ACT IN AEPB
1.0000 | INHALATION_SPRAY | Freq: Two times a day (BID) | RESPIRATORY_TRACT | 1 refills | Status: DC
Start: 2022-06-16 — End: 2022-09-12

## 2022-06-16 NOTE — Patient Instructions (Signed)
A few things to remember from today's visit:   Lower extremity edema - Plan: D-dimer, Quantitative, Basic metabolic panel  Asthma, intermittent, uncomplicated  SOB (shortness of breath)  Chronic pain of right knee  Iron deficiency anemia, unspecified iron deficiency anemia type - Plan: CBC, Iron, Ferritin  Colon cancer screening - Plan: Ambulatory referral to Gastroenterology  If you need refills please call your pharmacy. Do not use My Chart to request refills or for acute issues that need immediate attention.   Shortness of breath seems chronic, could be caused by asthma. Advair started today. Swish after use. Albuterol inh 2 puff every 6 hours for a week then as needed for wheezing or shortness of breath.  Keep your phone on you, we may need to send you to the ER if d dimer is elevated.  Lower extremity elevation will help with swelling. Continue following with ortho for knee pain.   Please be sure medication list is accurate. If a new problem present, please set up appointment sooner than planned today.

## 2022-06-18 ENCOUNTER — Ambulatory Visit (HOSPITAL_COMMUNITY)
Admission: RE | Admit: 2022-06-18 | Discharge: 2022-06-18 | Disposition: A | Discharge status: Home / Self Care | Payer: BC Managed Care – PPO | Source: Ambulatory Visit | Attending: Family Medicine | Admitting: Family Medicine

## 2022-06-18 DIAGNOSIS — R7989 Other specified abnormal findings of blood chemistry: Secondary | ICD-10-CM | POA: Diagnosis present

## 2022-07-16 ENCOUNTER — Encounter: Payer: Self-pay | Admitting: Gastroenterology

## 2022-07-23 ENCOUNTER — Encounter (INDEPENDENT_AMBULATORY_CARE_PROVIDER_SITE_OTHER): Payer: Self-pay

## 2022-07-31 ENCOUNTER — Telehealth: Payer: Self-pay | Admitting: *Deleted

## 2022-07-31 NOTE — Telephone Encounter (Signed)
Unable to reach patient after no show for pre visit.  LVM requesting patient return call to reschedule PV to avoid cancellation of colonoscopy.

## 2022-08-01 NOTE — Telephone Encounter (Signed)
Attempted to contact patient again regarding pre visit appt.  Unable to reach and no return phone call.  Colonoscopy cancelled and missed appointment letter mailed.

## 2022-08-07 ENCOUNTER — Ambulatory Visit
Admission: RE | Admit: 2022-08-07 | Discharge: 2022-08-07 | Disposition: A | Discharge status: Home / Self Care | Payer: BC Managed Care – PPO | Source: Ambulatory Visit | Attending: Family Medicine | Admitting: Family Medicine

## 2022-08-07 DIAGNOSIS — Z1231 Encounter for screening mammogram for malignant neoplasm of breast: Secondary | ICD-10-CM

## 2022-08-12 ENCOUNTER — Other Ambulatory Visit: Payer: Self-pay | Admitting: Family Medicine

## 2022-08-12 DIAGNOSIS — R928 Other abnormal and inconclusive findings on diagnostic imaging of breast: Secondary | ICD-10-CM

## 2022-08-25 ENCOUNTER — Ambulatory Visit
Admission: RE | Admit: 2022-08-25 | Discharge: 2022-08-25 | Disposition: A | Discharge status: Home / Self Care | Payer: BC Managed Care – PPO | Source: Ambulatory Visit | Attending: Family Medicine | Admitting: Family Medicine

## 2022-08-25 DIAGNOSIS — R928 Other abnormal and inconclusive findings on diagnostic imaging of breast: Secondary | ICD-10-CM

## 2022-08-26 ENCOUNTER — Encounter: Payer: Self-pay | Admitting: Gastroenterology

## 2022-08-27 ENCOUNTER — Encounter: Payer: BC Managed Care – PPO | Admitting: Gastroenterology
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
## 2022-09-11 ENCOUNTER — Ambulatory Visit (AMBULATORY_SURGERY_CENTER): Payer: Self-pay

## 2022-09-11 VITALS — Ht 64.0 in | Wt 248.0 lb

## 2022-09-11 DIAGNOSIS — Z1211 Encounter for screening for malignant neoplasm of colon: Secondary | ICD-10-CM

## 2022-09-11 MED ORDER — NA SULFATE-K SULFATE-MG SULF 17.5-3.13-1.6 GM/177ML PO SOLN: 1.0000 | Freq: Once | ORAL | 0 refills | Status: AC

## 2022-09-11 NOTE — Progress Notes (Signed)
No egg or soy allergy known to patient  No issues known to pt with past sedation with any surgeries or procedures Patient denies ever being told they had issues or difficulty with intubation  No FH of Malignant Hyperthermia Pt is not on diet pills Pt is not on  home 02  Pt is not on blood thinners  Pt has problems with constipation - 2day Miralax given No A fib or A flutter Have any cardiac testing pending--no Pt instructed to use [email or web address] or GoodRx for a price reduction on prep

## 2022-09-12 ENCOUNTER — Encounter: Payer: Self-pay | Admitting: Family Medicine

## 2022-09-12 ENCOUNTER — Ambulatory Visit (INDEPENDENT_AMBULATORY_CARE_PROVIDER_SITE_OTHER): Payer: BC Managed Care – PPO | Admitting: Family Medicine

## 2022-09-12 VITALS — BP 130/88 | HR 74 | Temp 98.5°F | Resp 18 | Ht 63.0 in | Wt 247.0 lb

## 2022-09-12 DIAGNOSIS — J4520 Mild intermittent asthma, uncomplicated: Secondary | ICD-10-CM | POA: Diagnosis not present

## 2022-09-12 DIAGNOSIS — Z0000 Encounter for general adult medical examination without abnormal findings: Secondary | ICD-10-CM | POA: Diagnosis not present

## 2022-09-12 DIAGNOSIS — I10 Essential (primary) hypertension: Secondary | ICD-10-CM | POA: Diagnosis not present

## 2022-09-12 MED ORDER — FLUTICASONE-SALMETEROL 100-50 MCG/ACT IN AEPB: 1.0000 | INHALATION_SPRAY | Freq: Two times a day (BID) | RESPIRATORY_TRACT | 1 refills | Status: DC

## 2022-09-12 NOTE — Patient Instructions (Addendum)
A few things to remember from today's visit:  Routine general medical examination at a health care facility  Essential hypertension  Asthma, intermittent, uncomplicated - Plan: fluticasone-salmeterol (ADVAIR DISKUS) 100-50 MCG/ACT AEPB  Morbid obesity ([place]) - Plan: Amb Ref to Medical Weight Management  Try Advair 2 times daily. Continue Omeprazole.  If you need refills for medications you take chronically, please call your pharmacy. Do not use My Chart to request refills or for acute issues that need immediate attention. If you send a my chart message, it may take a few days to be addressed, specially if I am not in the office.  Please be sure medication list is accurate. If a new problem present, please set up appointment sooner than planned today.  Health Maintenance, Female Adopting a healthy lifestyle and getting preventive care are important in promoting health and wellness. Ask your health care provider about: The right schedule for you to have regular tests and exams. Things you can do on your own to prevent diseases and keep yourself healthy. What should I know about diet, weight, and exercise? Eat a healthy diet  Eat a diet that includes plenty of vegetables, fruits, low-fat dairy products, and lean protein. Do not eat a lot of foods that are high in solid fats, added sugars, or sodium. Maintain a healthy weight Body mass index (BMI) is used to identify weight problems. It estimates body fat based on height and weight. Your health care provider can help determine your BMI and help you achieve or maintain a healthy weight. Get regular exercise Get regular exercise. This is one of the most important things you can do for your health. Most adults should: Exercise for at least 150 minutes each week. The exercise should increase your heart rate and make you sweat (moderate-intensity exercise). Do strengthening exercises at least twice a week. This is in addition to the  moderate-intensity exercise. Spend less time sitting. Even light physical activity can be beneficial. Watch cholesterol and blood lipids Have your blood tested for lipids and cholesterol at 46 years of age, then have this test every 5 years. Have your cholesterol levels checked more often if: Your lipid or cholesterol levels are high. You are older than 46 years of age. You are at high risk for heart disease. What should I know about cancer screening? Depending on your health history and family history, you may need to have cancer screening at various ages. This may include screening for: Breast cancer. Cervical cancer. Colorectal cancer. Skin cancer. Lung cancer. What should I know about heart disease, diabetes, and high blood pressure? Blood pressure and heart disease High blood pressure causes heart disease and increases the risk of stroke. This is more likely to develop in people who have high blood pressure readings or are overweight. Have your blood pressure checked: Every 3-5 years if you are 65-67 years of age. Every year if you are 23 years old or older. Diabetes Have regular diabetes screenings. This checks your fasting blood sugar level. Have the screening done: Once every three years after age 66 if you are at a normal weight and have a low risk for diabetes. More often and at a younger age if you are overweight or have a high risk for diabetes. What should I know about preventing infection? Hepatitis B If you have a higher risk for hepatitis B, you should be screened for this virus. Talk with your health care provider to find out if you are at risk for hepatitis  B infection. Hepatitis C Testing is recommended for: Everyone born from [birth year] through 1965. Anyone with known risk factors for hepatitis C. Sexually transmitted infections (STIs) Get screened for STIs, including gonorrhea and chlamydia, if: You are sexually active and are younger than 46 years of age. You are  older than 46 years of age and your health care provider tells you that you are at risk for this type of infection. Your sexual activity has changed since you were last screened, and you are at increased risk for chlamydia or gonorrhea. Ask your health care provider if you are at risk. Ask your health care provider about whether you are at high risk for HIV. Your health care provider may recommend a prescription medicine to help prevent HIV infection. If you choose to take medicine to prevent HIV, you should first get tested for HIV. You should then be tested every 3 months for as long as you are taking the medicine. Pregnancy If you are about to stop having your period (premenopausal) and you may become pregnant, seek counseling before you get pregnant. Take 400 to 800 micrograms (mcg) of folic acid every day if you become pregnant. Ask for birth control (contraception) if you want to prevent pregnancy. Osteoporosis and menopause Osteoporosis is a disease in which the bones lose minerals and strength with aging. This can result in bone fractures. If you are 3 years old or older, or if you are at risk for osteoporosis and fractures, ask your health care provider if you should: Be screened for bone loss. Take a calcium or vitamin D supplement to lower your risk of fractures. Be given hormone replacement therapy (HRT) to treat symptoms of menopause. Follow these instructions at home: Alcohol use Do not drink alcohol if: Your health care provider tells you not to drink. You are pregnant, may be pregnant, or are planning to become pregnant. If you drink alcohol: Limit how much you have to: 0-1 drink a day. Know how much alcohol is in your drink. In the [place] one drink equals one 12 oz bottle of beer (355 mL), one 5 oz glass of wine (148 mL), or one 1 oz glass of hard liquor (44 mL). Lifestyle Do not use any products that contain nicotine or tobacco. These products include cigarettes, chewing  tobacco, and vaping devices, such as e-cigarettes. If you need help quitting, ask your health care provider. Do not use street drugs. Do not share needles. Ask your health care provider for help if you need support or information about quitting drugs. General instructions Schedule regular health, dental, and eye exams. Stay current with your vaccines. Tell your health care provider if: You often feel depressed. You have ever been abused or do not feel safe at home. Summary Adopting a healthy lifestyle and getting preventive care are important in promoting health and wellness. Follow your health care provider's instructions about healthy diet, exercising, and getting tested or screened for diseases. Follow your health care provider's instructions on monitoring your cholesterol and blood pressure. This information is not intended to replace advice given to you by your health care provider. Make sure you discuss any questions you have with your health care provider. Document Revised: 04/22/2021 Document Reviewed: 04/22/2021 Elsevier Patient Education  [place].

## 2022-09-26 ENCOUNTER — Encounter: Payer: Self-pay | Admitting: Gastroenterology

## 2022-10-06 ENCOUNTER — Ambulatory Visit (AMBULATORY_SURGERY_CENTER): Payer: BC Managed Care – PPO | Admitting: Gastroenterology

## 2022-10-06 ENCOUNTER — Encounter: Payer: Self-pay | Admitting: Gastroenterology

## 2022-10-06 VITALS — BP 160/89 | HR 78 | Temp 98.2°F | Resp 13 | Ht 64.0 in | Wt 248.0 lb

## 2022-10-06 DIAGNOSIS — K639 Disease of intestine, unspecified: Secondary | ICD-10-CM | POA: Diagnosis not present

## 2022-10-06 DIAGNOSIS — D122 Benign neoplasm of ascending colon: Secondary | ICD-10-CM | POA: Diagnosis not present

## 2022-10-06 DIAGNOSIS — Z1211 Encounter for screening for malignant neoplasm of colon: Secondary | ICD-10-CM

## 2022-10-06 MED ORDER — SODIUM CHLORIDE 0.9 % IV SOLN: 500.0000 mL | Freq: Once | INTRAVENOUS | Status: DC

## 2022-10-06 NOTE — Op Note (Signed)
[facility] Patient Name: Lynn Hansen Procedure Date: 10/06/2022 8:41 AM MRN: [id number] Endoscopist: Nicki Reaper E. Candis Schatz , MD Age: 46 Referring MD:  Date of Birth: 06-27-1976 Gender: Female Account #: [id number] Procedure:                Colonoscopy Indications:              Screening for colorectal malignant neoplasm, This                            is the patient's first colonoscopy Medicines:                Monitored Anesthesia Care Procedure:                Pre-Anesthesia Assessment:                           - Prior to the procedure, a History and Physical                            was performed, and patient medications and                            allergies were reviewed. The patient's tolerance of                            previous anesthesia was also reviewed. The risks                            and benefits of the procedure and the sedation                            options and risks were discussed with the patient.                            All questions were answered, and informed consent                            was obtained. Prior Anticoagulants: The patient has                            taken no previous anticoagulant or antiplatelet                            agents. ASA Grade Assessment: III - A patient with                            severe systemic disease. After reviewing the risks                            and benefits, the patient was deemed in                            satisfactory condition to undergo the procedure.  After obtaining informed consent, the colonoscope                            was passed under direct vision. Throughout the                            procedure, the patient's blood pressure, pulse, and                            oxygen saturations were monitored continuously. The                            Olympus CF-HQ190L ([id number]) Colonoscope was                            introduced through the  anus and advanced to the the                            cecum, identified by appendiceal orifice and                            ileocecal valve. The colonoscopy was performed                            without difficulty. The patient tolerated the                            procedure well. The quality of the bowel                            preparation was good. The ileocecal valve,                            appendiceal orifice, and rectum were photographed.                            The bowel preparation used was SUPREP via split                            dose instruction. Scope In: 8:51:17 AM Scope Out: 9:07:45 AM Scope Withdrawal Time: 0 hours 14 minutes 32 seconds  Total Procedure Duration: 0 hours 16 minutes 28 seconds  Findings:                 The perianal and digital rectal examinations were                            normal. Pertinent negatives include normal                            sphincter tone and no palpable rectal lesions.                           A 2 mm polyp was found in the ascending colon. The  polyp was sessile. The polyp was removed with a                            cold snare. Resection and retrieval were complete.                            Estimated blood loss was minimal.                           One 25 mm yellowish submucosal nodule was found in                            the sigmoid colon. Biopsies were taken with a cold                            forceps for histology. Estimated blood loss was                            minimal.                           A few small-mouthed diverticula were found in the                            ascending colon.                           The exam was otherwise normal throughout the                            examined colon.                           The retroflexed view of the distal rectum and anal                            verge was normal and showed no anal or rectal                             abnormalities. Complications:            No immediate complications. Estimated Blood Loss:     Estimated blood loss was minimal. Impression:               - One 2 mm polyp in the ascending colon, removed                            with a cold snare. Resected and retrieved.                           - Submucosal nodule in the sigmoid colon. Biopsied.                            The lesion seemed too firm to be a lipoma.                           -  Diverticulosis in the ascending colon.                           - The distal rectum and anal verge are normal on                            retroflexion view. Recommendation:           - Patient has a contact number available for                            emergencies. The signs and symptoms of potential                            delayed complications were discussed with the                            patient. Return to normal activities tomorrow.                            Written discharge instructions were provided to the                            patient.                           - Resume previous diet.                           - Continue present medications.                           - Await pathology results.                           - Repeat colonoscopy (date not yet determined) for                            surveillance based on pathology results.                           - Will likely need CT scan based on biopsies of                            sigmoid nodule [place] Wayson E. Candis Schatz, MD 10/06/2022 9:16:20 AM This report has been signed electronically.

## 2022-10-06 NOTE — Progress Notes (Signed)
Pt's states no medical or surgical changes since previsit or office visit. 

## 2022-10-06 NOTE — Patient Instructions (Signed)
Handouts on polyps and diverticulosis given to you today  Await pathology results   YOU HAD AN ENDOSCOPIC PROCEDURE TODAY AT THE [facility]:   Refer to the procedure report that was given to you for any specific questions about what was found during the examination.  If the procedure report does not answer your questions, please call your gastroenterologist to clarify.  If you requested that your care partner not be given the details of your procedure findings, then the procedure report has been included in a sealed envelope for you to review at your convenience later.  YOU SHOULD EXPECT: Some feelings of bloating in the abdomen. Passage of more gas than usual.  Walking can help get rid of the air that was put into your GI tract during the procedure and reduce the bloating. If you had a lower endoscopy (such as a colonoscopy or flexible sigmoidoscopy) you may notice spotting of blood in your stool or on the toilet paper. If you underwent a bowel prep for your procedure, you may not have a normal bowel movement for a few days.  Please Note:  You might notice some irritation and congestion in your nose or some drainage.  This is from the oxygen used during your procedure.  There is no need for concern and it should clear up in a day or so.  SYMPTOMS TO REPORT IMMEDIATELY:  Following lower endoscopy (colonoscopy or flexible sigmoidoscopy):  Excessive amounts of blood in the stool  Significant tenderness or worsening of abdominal pains  Swelling of the abdomen that is new, acute  Fever of 100F or higher  For urgent or emergent issues, a gastroenterologist can be reached at any hour by calling [phone]. Do not use MyChart messaging for urgent concerns.    DIET:  We do recommend a small meal at first, but then you may proceed to your regular diet.  Drink plenty of fluids but you should avoid alcoholic beverages for 24 hours.  ACTIVITY:  You should plan to take it easy for the  rest of today and you should NOT DRIVE or use heavy machinery until tomorrow (because of the sedation medicines used during the test).    FOLLOW UP: Our staff will call the number listed on your records the next business day following your procedure.  We will call around 7:15- 8:00 am to check on you and address any questions or concerns that you may have regarding the information given to you following your procedure. If we do not reach you, we will leave a message.     If any biopsies were taken you will be contacted by phone or by letter within the next 1-3 weeks.  Please call us at [phone] if you have not heard about the biopsies in 3 weeks.    SIGNATURES/CONFIDENTIALITY: You and/or your care partner have signed paperwork which will be entered into your electronic medical record.  These signatures attest to the fact that that the information above on your After Visit Summary has been reviewed and is understood.  Full responsibility of the confidentiality of this discharge information lies with you and/or your care-partner. 

## 2022-10-06 NOTE — Progress Notes (Signed)
To pacu, VSS. Report to [email or web address] 
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
## 2022-10-07 ENCOUNTER — Telehealth: Payer: Self-pay

## 2022-10-07 NOTE — Telephone Encounter (Signed)
  Follow up Call-     10/06/2022    7:57 AM  Call back number  Post procedure Call Back phone  # [phone]  Permission to leave phone message Yes     Patient questions:  Do you have a fever, pain , or abdominal swelling? No. Pain Score  0 *  Have you tolerated food without any problems? Yes.    Have you been able to return to your normal activities? Yes.    Do you have any questions about your discharge instructions: Diet   No. Medications  No. Follow up visit  No.  Do you have questions or concerns about your Care? No.  Actions: * If pain score is 4 or above: No action needed, pain <4.

## 2022-10-14 ENCOUNTER — Encounter (INDEPENDENT_AMBULATORY_CARE_PROVIDER_SITE_OTHER): Payer: Self-pay

## 2022-10-16 ENCOUNTER — Other Ambulatory Visit: Payer: Self-pay

## 2022-10-16 ENCOUNTER — Telehealth: Payer: Self-pay | Admitting: Gastroenterology

## 2022-10-16 DIAGNOSIS — K639 Disease of intestine, unspecified: Secondary | ICD-10-CM

## 2022-10-16 NOTE — Telephone Encounter (Signed)
Patient called seeking pathology results.

## 2022-10-16 NOTE — Telephone Encounter (Signed)
Spoke with pt and explained path results as she did not understand the message. She thanked me for the call and verbalized understanding.

## 2022-10-16 NOTE — Progress Notes (Signed)
Lynn Hansen,  The biopsies taken of the larger nodule were not diagnostic (tissue from the deeper layers were not sampled).  As discussed, this will require further evaluation.  First step will be to obtain a CT scan of the abdomen and pelvis with contrast.  You will likely need an endoscopic ultrasound after the CT scan to further characterize the lesion and take samples from the deeper layers.  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  Current guidelines recommend repeating a colonoscopy in 7 years for patients with 1-2 small tubular adenomas.  However, you may need closer surveillance based on the findings from the other larger nodule.  [name], Can you please order a CT abdomen/pelvis with IV/oral contrast from Lynn Hansen?

## 2022-10-27 ENCOUNTER — Ambulatory Visit (HOSPITAL_COMMUNITY)
Admission: RE | Admit: 2022-10-27 | Discharge: 2022-10-27 | Disposition: A | Discharge status: Home / Self Care | Payer: BC Managed Care – PPO | Source: Ambulatory Visit | Attending: Gastroenterology | Admitting: Gastroenterology

## 2022-10-27 ENCOUNTER — Encounter (HOSPITAL_COMMUNITY): Payer: Self-pay

## 2022-10-27 DIAGNOSIS — K639 Disease of intestine, unspecified: Secondary | ICD-10-CM | POA: Insufficient documentation

## 2022-10-27 MED ORDER — IOHEXOL 300 MG/ML  SOLN
100.0000 mL | Freq: Once | INTRAMUSCULAR | Status: AC | PRN
  Administered 2022-10-27: 100 mL via INTRAVENOUS

## 2022-10-27 MED ORDER — SODIUM CHLORIDE (PF) 0.9 % IJ SOLN
INTRAMUSCULAR | Status: AC
  Filled 2022-10-27: qty 50

## 2022-10-27 NOTE — Progress Notes (Signed)
Appointment cancelled - please disregard. 

## 2022-11-19 NOTE — Progress Notes (Unsigned)
ACUTE VISIT Chief Complaint  Patient presents with   ADHD   Mass    On chest, noticed about a month ago   HPI: [name] is a 46 y.o. female, who is here today complaining of *** HPI    11/21/2022    2:18 PM 09/12/2022    9:01 AM 06/16/2022    8:42 AM 01/03/2022    3:55 PM 09/30/2021    7:40 AM  Depression screen PHQ 2/9  Decreased Interest 1 1 0 1 3  Down, Depressed, Hopeless '1 1 2 '$ 0 3  PHQ - 2 Score '2 2 2 1 6  '$ Altered sleeping 0 0 0 0 0  Tired, decreased energy '1 1 1 1 1  '$ Change in appetite '1 3 2 1 '$ 0  Feeling bad or failure about yourself  '1 2 2 1 '$ 0  Trouble concentrating '1 2 3 2 3  '$ Moving slowly or fidgety/restless 0 0 0 0 0  Suicidal thoughts 0 0 0 0 0  PHQ-9 Score '6 10 10 6 10  '$ Difficult doing work/chores Somewhat difficult Somewhat difficult Extremely dIfficult Somewhat difficult Somewhat difficult      11/21/2022    5:20 PM  GAD 7 : Generalized Anxiety Score  Nervous, Anxious, on Edge 2  Control/stop worrying 2  Worry too much - different things 2  Trouble relaxing 2  Restless 2  Easily annoyed or irritable 2  Afraid - awful might happen 2  Total GAD 7 Score 14  Anxiety Difficulty Very difficult   Lab Results  Component Value Date   WBC 6.7 06/16/2022   HGB 10.6 (L) 06/16/2022   HCT 33.3 (L) 06/16/2022   MCV 73.2 (L) 06/16/2022   PLT 452.0 (H) 06/16/2022    Review of Systems See other pertinent positives and negatives in HPI.  Current Outpatient Medications on File Prior to Visit  Medication Sig Dispense Refill   albuterol (VENTOLIN HFA) 108 (90 Base) MCG/ACT inhaler Inhale 2 puffs into the lungs every 6 (six) hours as needed for wheezing or shortness of breath. 8 g 0   fluticasone (FLONASE) 50 MCG/ACT nasal spray Place 1 spray into both nostrils 2 (two) times daily. 16 g 3   fluticasone-salmeterol (ADVAIR DISKUS) 100-50 MCG/ACT AEPB Inhale 1 puff into the lungs 2 (two) times daily. 1 each 1   omeprazole (PRILOSEC) 20 MG capsule Take 20  mg by mouth daily. Unsure of strength     No current facility-administered medications on file prior to visit.   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   HSV (herpes simplex virus) infection    Hypertension    Lactose intolerance    Migraines    Pre-diabetes    Sebaceous cyst of ear    right ear   No Known Allergies  Social History   Socioeconomic History   Marital status: Single    Spouse name: Not on file   Number of children: 1   Years of education:  Not on file   Highest education level: Not on file  Occupational History   Occupation: Education officer, museum  Tobacco Use   Smoking status: Never   Smokeless tobacco: Never  Vaping Use   Vaping Use: Never used  Substance and Sexual Activity   Alcohol use: Not Currently   Drug use: No   Sexual activity: Yes    Partners: Male  Other Topics Concern   Not on file  Social History Narrative   Not on file   Social Determinants of Health   Financial Resource Strain: Not on file  Food Insecurity: Not on file  Transportation Needs: Not on file  Physical Activity: Not on file  Stress: Not on file  Social Connections: Not on file    Vitals:   11/21/22 1409  BP: 120/80  Pulse: 92  Temp: 99.1 F (37.3 C)  SpO2: 99%   Body mass index is 42.78 kg/m.  Physical Exam Vitals and nursing note reviewed.  Constitutional:      General: She is not in acute distress.    Appearance: She is well-developed.  HENT:     Head: Normocephalic and atraumatic.  Eyes:     Conjunctiva/sclera: Conjunctivae normal.  Cardiovascular:     Rate and  Rhythm: Normal rate and regular rhythm.     Heart sounds: No murmur heard. Pulmonary:     Effort: Pulmonary effort is normal. No respiratory distress.     Breath sounds: Normal breath sounds.  Chest:       Comments: *** Skin:    General: Skin is warm.     Findings: Lesion present. No erythema or rash.  Neurological:     General: No focal deficit present.     Mental Status: She is alert and oriented to person, place, and time.     Gait: Gait normal.  Psychiatric:        Mood and Affect: Affect normal. Mood is anxious.        Thought Content: Thought content does not include suicidal ideation. Thought content does not include suicidal plan.        ASSESSMENT AND PLAN: There are no diagnoses linked to this encounter.  No follow-ups on file.  Ajanee Buren G. [place], MD  [facility]. [place] office.

## 2022-11-21 ENCOUNTER — Encounter: Payer: Self-pay | Admitting: Family Medicine

## 2022-11-21 ENCOUNTER — Ambulatory Visit: Payer: BC Managed Care – PPO | Admitting: Family Medicine

## 2022-11-21 VITALS — BP 120/80 | HR 92 | Temp 99.1°F | Resp 12 | Ht 64.0 in | Wt 249.2 lb

## 2022-11-21 DIAGNOSIS — F330 Major depressive disorder, recurrent, mild: Secondary | ICD-10-CM

## 2022-11-21 DIAGNOSIS — R41840 Attention and concentration deficit: Secondary | ICD-10-CM | POA: Diagnosis not present

## 2022-11-21 DIAGNOSIS — D500 Iron deficiency anemia secondary to blood loss (chronic): Secondary | ICD-10-CM

## 2022-11-21 DIAGNOSIS — L989 Disorder of the skin and subcutaneous tissue, unspecified: Secondary | ICD-10-CM

## 2022-11-21 DIAGNOSIS — F419 Anxiety disorder, unspecified: Secondary | ICD-10-CM

## 2022-11-21 MED ORDER — SERTRALINE HCL 50 MG PO TABS: ORAL_TABLET | ORAL | 1 refills | Status: DC

## 2022-11-21 NOTE — Patient Instructions (Signed)
A few things to remember from today's visit:  Anxiety disorder, unspecified type - Plan: sertraline (ZOLOFT) 50 MG tablet  Difficulty concentrating  Iron deficiency anemia due to chronic blood loss  Skin lesion of chest wall - Plan: Ambulatory referral to Dermatology Today we started Sertraline, this type of medications can increase suicidal risk. This is more prevalent among children,adolecents, and young adults with major depression or other psychiatric disorders. It can also make depression worse. Most common side effects are gastrointestinal, self limited after a few weeks: diarrhea, nausea, constipation  Or diarrhea among some.  In general it is well tolerated. We will follow closely.     If you need refills for medications you take chronically, please call your pharmacy. Do not use My Chart to request refills or for acute issues that need immediate attention. If you send a my chart message, it may take a few days to be addressed, specially if I am not in the office.  Please be sure medication list is accurate. If a new problem present, please set up appointment sooner than planned today.

## 2022-11-22 ENCOUNTER — Encounter: Payer: Self-pay | Admitting: Family Medicine

## 2022-12-01 ENCOUNTER — Ambulatory Visit (INDEPENDENT_AMBULATORY_CARE_PROVIDER_SITE_OTHER): Payer: BC Managed Care – PPO | Admitting: Psychology

## 2022-12-01 DIAGNOSIS — F330 Major depressive disorder, recurrent, mild: Secondary | ICD-10-CM | POA: Diagnosis not present
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
## 2022-12-26 ENCOUNTER — Other Ambulatory Visit: Payer: Self-pay

## 2022-12-26 DIAGNOSIS — F419 Anxiety disorder, unspecified: Secondary | ICD-10-CM

## 2022-12-26 MED ORDER — SERTRALINE HCL 50 MG PO TABS: ORAL_TABLET | ORAL | 0 refills | Status: DC

## 2023-01-02 ENCOUNTER — Ambulatory Visit: Payer: BC Managed Care – PPO | Admitting: Family Medicine

## 2023-01-05 ENCOUNTER — Encounter: Payer: BC Managed Care – PPO | Admitting: Psychology

## 2023-01-05 NOTE — Progress Notes (Signed)
This encounter was created in error - please disregard.

## 2023-01-12 ENCOUNTER — Encounter: Payer: BC Managed Care – PPO | Admitting: Psychology

## 2023-01-12 NOTE — Progress Notes (Signed)
This encounter was created in error - please disregard.

## 2023-01-19 ENCOUNTER — Ambulatory Visit: Payer: BC Managed Care – PPO | Admitting: Psychology

## 2023-03-06 ENCOUNTER — Other Ambulatory Visit: Payer: Self-pay | Admitting: Family Medicine

## 2023-03-06 DIAGNOSIS — I10 Essential (primary) hypertension: Secondary | ICD-10-CM

## 2023-03-26 NOTE — Progress Notes (Unsigned)
ACUTE VISIT Chief Complaint  Patient presents with   Allergies    Getting worse, has been using Claritin with no improvement; has noticed some wheezing.   HPI: [name] is a 47 y.o. female, who is here today complaining of *** HPI  Review of Systems See other pertinent positives and negatives in HPI.  Current Outpatient Medications on File Prior to Visit  Medication Sig Dispense Refill   albuterol (VENTOLIN HFA) 108 (90 Base) MCG/ACT inhaler Inhale 2 puffs into the lungs every 6 (six) hours as needed for wheezing or shortness of breath. 8 g 0   fluticasone (FLONASE) 50 MCG/ACT nasal spray Place 1 spray into both nostrils 2 (two) times daily. 16 g 3   omeprazole (PRILOSEC) 20 MG capsule Take 20 mg by mouth daily. Unsure of strength     sertraline (ZOLOFT) 50 MG tablet 1/2 tab daily for 2 weeks then whole tab daily. 90 tablet 0   No current facility-administered medications on file prior to visit.   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   HSV (herpes simplex virus) infection    Hypertension    Lactose intolerance    Migraines    Pre-diabetes    Sebaceous cyst of ear    right ear   No Known Allergies  Social History   Socioeconomic History   Marital status: Single    Spouse name: Not on file   Number of children: 1   Years of education: Not on file   Highest education level: Not on file  Occupational History   Occupation: Engineer, site  Tobacco Use   Smoking status: Never   Smokeless tobacco: Never  Vaping Use   Vaping Use: Never used  Substance and Sexual  Activity   Alcohol use: Not Currently   Drug use: No   Sexual activity: Yes    Partners: Male  Other Topics Concern   Not on file  Social History Narrative   Not on file   Social Determinants of Health   Financial Resource Strain: Not on file  Food Insecurity: Not on file  Transportation Needs: Not on file  Physical Activity: Not on file  Stress: Not on file  Social Connections: Not on file   Vitals:   03/27/23 0823  BP: 134/80  Pulse: 82  Resp: 16  Temp: 98.4 F (36.9 C)  SpO2: 98%   Body mass index is 42.98 kg/m.  Physical Exam Constitutional:      General: She is not in acute distress.    Appearance: She is well-developed. She is not ill-appearing.  HENT:     Head: Atraumatic.     Right Ear: External ear normal.     Left Ear: External ear normal.     Ears:     Comments: Excess cerumen in ear canals bilateral, TM's seen partially.    Nose: Rhinorrhea present.     Right Turbinates: Enlarged.     Left Turbinates: Enlarged.  Eyes:  Conjunctiva/sclera: Conjunctivae normal.  Cardiovascular:     Rate and Rhythm: Normal rate and regular rhythm.     Heart sounds: No murmur heard. Pulmonary:     Effort: Pulmonary effort is normal. No respiratory distress.     Breath sounds: Normal breath sounds. No stridor.  Lymphadenopathy:     Cervical: No cervical adenopathy.  Skin:    General: Skin is warm.     Findings: No erythema or rash.     Comments: Postinflammatory pigmentation changes under breast, creases.  Neurological:     Mental Status: She is alert and oriented to person, place, and time.  Psychiatric:        Speech: Speech normal.     Comments: Well groomed, good eye contact.   ASSESSMENT AND PLAN: Allergic rhinitis due to pollen, unspecified seasonality Assessment & Plan: Stop Claritin, which she has taken for about 3 weeks and not helping. Zyrtec 10 mg daily in the morning. Singulair 10 mg at night added today. Flonase nasal spray at bedtime one  spray in each nostril as needed. Nasal saline irrigations several times throughout the day as needed. If symptoms are persisting, immunology referral will be considered.  Orders: -     Montelukast Sodium; Take 1 tablet (10 mg total) by mouth at bedtime.  Dispense: 90 tablet; Refill: 1  Asthma, intermittent, uncomplicated Assessment & Plan: Problam does not seem to be well-controlled. No wheezing appreciated today. Advair changed from 100-50 mcg to 250-50 mcg to use bid. Singulair 10 mg added. Albuterol inh 2 puff every 6 hours for a week then as needed for wheezing or shortness of breath.  Wt loss will help. F/U in 2 months.  Orders: -     Fluticasone-Salmeterol; Inhale 1 puff into the lungs in the morning and at bedtime.  Dispense: 60 each; Refill: 2 -     Montelukast Sodium; Take 1 tablet (10 mg total) by mouth at bedtime.  Dispense: 90 tablet; Refill: 1  Intertrigo Assessment & Plan: We discussed diagnosis, prognosis, and treatment options. Try to keep affected areas dry, cotton underwear, and use antibacterial soap hypertense per week. Nystatin-triamcinolone cream twice daily as needed. OTC Lotrimin powder daily to keep areas dry may help. Follow-up as needed.  Orders: -     Nystatin-Triamcinolone; Apply 1 Application topically 2 (two) times daily as needed.  Dispense: 45 g; Refill: 0    Return in about 2 months (around 05/27/2023) for chronic problems.  Cheryllynn Sarff G. [place], MD  [facility]. [place] office.

## 2023-03-27 ENCOUNTER — Encounter: Payer: Self-pay | Admitting: Family Medicine

## 2023-03-27 ENCOUNTER — Ambulatory Visit (INDEPENDENT_AMBULATORY_CARE_PROVIDER_SITE_OTHER): Payer: BC Managed Care – PPO | Admitting: Family Medicine

## 2023-03-27 VITALS — BP 134/80 | HR 82 | Temp 98.4°F | Resp 16 | Ht 64.0 in | Wt 250.4 lb

## 2023-03-27 DIAGNOSIS — L304 Erythema intertrigo: Secondary | ICD-10-CM | POA: Diagnosis not present

## 2023-03-27 DIAGNOSIS — J301 Allergic rhinitis due to pollen: Secondary | ICD-10-CM | POA: Diagnosis not present

## 2023-03-27 DIAGNOSIS — J4520 Mild intermittent asthma, uncomplicated: Secondary | ICD-10-CM | POA: Diagnosis not present

## 2023-03-27 MED ORDER — MONTELUKAST SODIUM 10 MG PO TABS: 10.0000 mg | ORAL_TABLET | Freq: Every day | ORAL | 1 refills | Status: DC

## 2023-03-27 MED ORDER — FLUTICASONE-SALMETEROL 250-50 MCG/ACT IN AEPB: 1.0000 | INHALATION_SPRAY | Freq: Two times a day (BID) | RESPIRATORY_TRACT | 2 refills | Status: DC

## 2023-03-27 MED ORDER — NYSTATIN-TRIAMCINOLONE 100000-0.1 UNIT/GM-% EX CREA: 1.0000 | TOPICAL_CREAM | Freq: Two times a day (BID) | CUTANEOUS | 0 refills | Status: AC | PRN

## 2023-03-27 NOTE — Patient Instructions (Addendum)
A few things to remember from today's visit:  Allergic rhinitis due to pollen, unspecified seasonality - Plan: montelukast (SINGULAIR) 10 MG tablet  Asthma, intermittent, uncomplicated - Plan: fluticasone-salmeterol (ADVAIR) 250-50 MCG/ACT AEPB, montelukast (SINGULAIR) 10 MG tablet  Intertrigo - Plan: nystatin-triamcinolone (MYCOLOG II) cream  Keep areas of skin dry, over the counter lotrimin powder may help. Antibacterial soap. Singulair 10 mg daily as bed time. Zyrtec 10 mg in the morning. Nasal saline irrigations as needed.  If you need refills for medications you take chronically, please call your pharmacy. Do not use My Chart to request refills or for acute issues that need immediate attention. If you send a my chart message, it may take a few days to be addressed, specially if I am not in the office.  Please be sure medication list is accurate. If a new problem present, please set up appointment sooner than planned today.

## 2023-03-27 NOTE — Assessment & Plan Note (Signed)
We discussed diagnosis, prognosis, and treatment options. Try to keep affected areas dry, wear cotton underwear, and use antibacterial soap a few times per week. Nystatin-triamcinolone cream twice daily as needed. OTC Lotrimin powder daily to keep areas dry may help. Follow-up as needed.

## 2023-03-27 NOTE — Assessment & Plan Note (Signed)
Stop Claritin, which she has taken for about 3 weeks and not helping. Zyrtec 10 mg daily in the morning. Singulair 10 mg at night added today. Flonase nasal spray at bedtime one spray in each nostril as needed. Nasal saline irrigations several times throughout the day as needed. If symptoms are persisting, immunology referral will be considered.

## 2023-03-27 NOTE — Assessment & Plan Note (Addendum)
Problem does not seem to be well-controlled. No wheezing appreciated today. Advair changed from 100-50 mcg to 250-50 mcg to use bid.Rinse after use. Singulair 10 mg added today, it has helped in the past. Albuterol inh 2 puff every 6 hours for a week then as needed for wheezing or shortness of breath.  Wt loss will help. F/U in 2 months.

## 2023-06-12 NOTE — Progress Notes (Unsigned)
   ACUTE VISIT No chief complaint on file.  HPI: [name] is a 47 y.o. female, who is here today complaining of *** HPI  Review of Systems See other pertinent positives and negatives in HPI.  Current Outpatient Medications on File Prior to Visit  Medication Sig Dispense Refill   albuterol (VENTOLIN HFA) 108 (90 Base) MCG/ACT inhaler Inhale 2 puffs into the lungs every 6 (six) hours as needed for wheezing or shortness of breath. 8 g 0   fluticasone (FLONASE) 50 MCG/ACT nasal spray Place 1 spray into both nostrils 2 (two) times daily. 16 g 3   fluticasone-salmeterol (ADVAIR) 250-50 MCG/ACT AEPB Inhale 1 puff into the lungs in the morning and at bedtime. 60 each 2   montelukast (SINGULAIR) 10 MG tablet Take 1 tablet (10 mg total) by mouth at bedtime. 90 tablet 1   nystatin-triamcinolone (MYCOLOG II) cream Apply 1 Application topically 2 (two) times daily as needed. 45 g 0   omeprazole (PRILOSEC) 20 MG capsule Take 20 mg by mouth daily. Unsure of strength     sertraline (ZOLOFT) 50 MG tablet 1/2 tab daily for 2 weeks then whole tab daily. 90 tablet 0   No current facility-administered medications on file prior to visit.    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   HSV (herpes simplex virus) infection    Hypertension    Lactose intolerance    Migraines    Pre-diabetes    Sebaceous cyst of ear    right ear   No Known Allergies  Social History   Socioeconomic History   Marital status: Single    Spouse name: Not on file   Number of children: 1   Years of education: Not on  file   Highest education level: Not on file  Occupational History   Occupation: Engineer, site  Tobacco Use   Smoking status: Never   Smokeless tobacco: Never  Vaping Use   Vaping Use: Never used  Substance and Sexual Activity   Alcohol use: Not Currently   Drug use: No   Sexual activity: Yes    Partners: Male  Other Topics Concern   Not on file  Social History Narrative   Not on file   Social Determinants of Health   Financial Resource Strain: Not on file  Food Insecurity: Not on file  Transportation Needs: Not on file  Physical Activity: Not on file  Stress: Not on file  Social Connections: Not on file    There were no vitals filed for this visit. There is no height or weight on file to calculate BMI.  Physical Exam  ASSESSMENT AND PLAN: There are no diagnoses linked to this encounter.  No follow-ups on file.  Mone Commisso G. [place], MD  [facility]. [place] office.  Discharge Instructions   None

## 2023-06-15 ENCOUNTER — Encounter: Payer: Self-pay | Admitting: Family Medicine

## 2023-06-15 ENCOUNTER — Ambulatory Visit: Payer: BC Managed Care – PPO | Admitting: Family Medicine

## 2023-06-15 VITALS — BP 120/80 | HR 85 | Temp 97.5°F | Resp 12 | Ht 64.0 in | Wt 251.2 lb

## 2023-06-15 DIAGNOSIS — Z8781 Personal history of (healed) traumatic fracture: Secondary | ICD-10-CM | POA: Diagnosis not present

## 2023-06-15 DIAGNOSIS — E559 Vitamin D deficiency, unspecified: Secondary | ICD-10-CM

## 2023-06-15 DIAGNOSIS — G4733 Obstructive sleep apnea (adult) (pediatric): Secondary | ICD-10-CM

## 2023-06-15 DIAGNOSIS — E876 Hypokalemia: Secondary | ICD-10-CM

## 2023-06-15 DIAGNOSIS — D509 Iron deficiency anemia, unspecified: Secondary | ICD-10-CM | POA: Insufficient documentation

## 2023-06-15 DIAGNOSIS — R5382 Chronic fatigue, unspecified: Secondary | ICD-10-CM | POA: Diagnosis not present

## 2023-06-15 DIAGNOSIS — K5900 Constipation, unspecified: Secondary | ICD-10-CM | POA: Diagnosis not present

## 2023-06-15 LAB — BASIC METABOLIC PANEL
BUN: 7 mg/dL (ref 6–23)
CO2: 25 mEq/L (ref 19–32)
Calcium: 9 mg/dL (ref 8.4–10.5)
Chloride: 102 mEq/L (ref 96–112)
Creatinine, Ser: 0.55 mg/dL (ref 0.40–1.20)
GFR: 109.74 mL/min (ref >60.00)
Glucose, Bld: 104 mg/dL — ABNORMAL HIGH (ref 70–99)
Potassium: 3.3 mEq/L — ABNORMAL LOW (ref 3.5–5.1)
Sodium: 136 mEq/L (ref 135–145)

## 2023-06-15 LAB — CBC
HCT: 33.5 % — ABNORMAL LOW (ref 36.0–46.0)
Hemoglobin: 10.5 g/dL — ABNORMAL LOW (ref 12.0–15.0)
MCHC: 31.4 g/dL (ref 30.0–36.0)
MCV: 73.6 fl — ABNORMAL LOW (ref 78.0–100.0)
Platelets: 464 10*3/uL — ABNORMAL HIGH (ref 150.0–400.0)
RBC: 4.56 Mil/uL (ref 3.87–5.11)
RDW: 17.3 % — ABNORMAL HIGH (ref 11.5–15.5)
WBC: 6.6 10*3/uL (ref 4.0–10.5)

## 2023-06-15 LAB — VITAMIN D 25 HYDROXY (VIT D DEFICIENCY, FRACTURES): VITD: 17.77 ng/mL — ABNORMAL LOW (ref 30.00–100.00)

## 2023-06-15 LAB — TSH: TSH: 1.84 u[IU]/mL (ref 0.35–5.50)

## 2023-06-15 MED ORDER — POTASSIUM CHLORIDE CRYS ER 20 MEQ PO TBCR
20.0000 meq | EXTENDED_RELEASE_TABLET | Freq: Every day | ORAL | 0 refills | Status: DC
Start: 2023-06-15 — End: 2023-11-10

## 2023-06-15 MED ORDER — VITAMIN D (ERGOCALCIFEROL) 1.25 MG (50000 UNIT) PO CAPS
50000.0000 [IU] | ORAL_CAPSULE | ORAL | 0 refills | Status: AC
Start: 2023-06-15 — End: 2023-08-04

## 2023-06-15 MED ORDER — FERROUS SULFATE 325 (65 FE) MG PO TABS: 325.0000 mg | ORAL_TABLET | Freq: Every day | ORAL | 2 refills | Status: DC

## 2023-06-15 NOTE — Assessment & Plan Note (Addendum)
We discussed possible etiologies: Systemic illness, immunologic,endocrinology,sleep disorder, psychiatric/psychologic, infectious,medications side effects, and idiopathic. She denies current symptoms of depression. Examination today does not suggest a serious process. Healthy diet and regular physical activity may help. Sleep apnea to be considered, study will be arranged. Further recommendations will be given according to lab results.

## 2023-06-15 NOTE — Patient Instructions (Addendum)
A few things to remember from today's visit:  Chronic fatigue - Plan: CBC, Basic metabolic panel, TSH  Constipation, unspecified constipation type - Plan: CBC, TSH  Hx of closed fracture - Plan: DG Bone Density  OSA (obstructive sleep apnea) - Plan: Ambulatory referral to Sleep Studies  Vitamin D deficiency, unspecified - Plan: VITAMIN D 25 Hydroxy (Vit-D Deficiency, Fractures)  Miralax daily as needed. If not effective add senokot. Benefiber 1 tsp 2 times daily. Increase fiber and water intake.  If you need refills for medications you take chronically, please call your pharmacy. Do not use My Chart to request refills or for acute issues that need immediate attention. If you send a my chart message, it may take a few days to be addressed, specially if I am not in the office.  Please be sure medication list is accurate. If a new problem present, please set up appointment sooner than planned today.

## 2023-06-15 NOTE — Assessment & Plan Note (Signed)
She is not on vitamin D supplementation. Further recommendation will be given according to 25 OH vitamin D result. 

## 2023-06-15 NOTE — Assessment & Plan Note (Signed)
Stressed the importance of adequate fiber and fluid intake. OTC MiraLAX daily as needed recommended.  She can add Senokot if still having a problem. Benefiber 1 teaspoon twice daily is a good option for fiber source.

## 2023-06-15 NOTE — Assessment & Plan Note (Signed)
Within the past few years: 2 left ankle fractures, right ankle fracture x 1, and recently right radius and thumb fracture. She reports that one of her ankle fractures was with not significant trauma. DEXA will be arranged.

## 2023-06-15 NOTE — Assessment & Plan Note (Signed)
Noted on labs done today. Will send to the pharmacy ferrous sulfate 325 mg to take every other day with vitamin C. We will recheck CBC and add iron studies in 4 to 6 weeks. Colonoscopy in 09/2022.

## 2023-06-15 NOTE — Assessment & Plan Note (Signed)
Patient understands the benefits of wt loss as well as adverse effects of obesity. Consistency with healthy diet and physical activity encouraged. 

## 2023-08-05 ENCOUNTER — Telehealth: Payer: Self-pay

## 2023-08-05 NOTE — Addendum Note (Signed)
Addended by: Kathreen Devoid on: 08/05/2023 07:47 AM   Modules accepted: Orders

## 2023-08-05 NOTE — Telephone Encounter (Signed)
Scheduled for 8/23 at 10am.

## 2023-08-05 NOTE — Telephone Encounter (Signed)
-----   Message from [facility] sent at 06/17/2023 10:30 AM EDT ----- Cbc in 6 weeks

## 2023-08-07 ENCOUNTER — Other Ambulatory Visit: Payer: BC Managed Care – PPO

## 2023-09-22 ENCOUNTER — Encounter: Payer: BC Managed Care – PPO | Admitting: Family Medicine
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
## 2023-11-10 ENCOUNTER — Encounter: Payer: Self-pay | Admitting: Family Medicine

## 2023-11-10 ENCOUNTER — Ambulatory Visit: Payer: BC Managed Care – PPO | Admitting: Family Medicine

## 2023-11-10 VITALS — BP 132/80 | HR 87 | Temp 98.4°F | Resp 12 | Ht 64.0 in | Wt 259.0 lb

## 2023-11-10 DIAGNOSIS — D500 Iron deficiency anemia secondary to blood loss (chronic): Secondary | ICD-10-CM | POA: Diagnosis not present

## 2023-11-10 DIAGNOSIS — Z0000 Encounter for general adult medical examination without abnormal findings: Secondary | ICD-10-CM

## 2023-11-10 DIAGNOSIS — Z13228 Encounter for screening for other metabolic disorders: Secondary | ICD-10-CM

## 2023-11-10 DIAGNOSIS — E785 Hyperlipidemia, unspecified: Secondary | ICD-10-CM | POA: Diagnosis not present

## 2023-11-10 DIAGNOSIS — E876 Hypokalemia: Secondary | ICD-10-CM

## 2023-11-10 DIAGNOSIS — J4520 Mild intermittent asthma, uncomplicated: Secondary | ICD-10-CM

## 2023-11-10 DIAGNOSIS — E559 Vitamin D deficiency, unspecified: Secondary | ICD-10-CM

## 2023-11-10 DIAGNOSIS — D509 Iron deficiency anemia, unspecified: Secondary | ICD-10-CM

## 2023-11-10 DIAGNOSIS — Z1329 Encounter for screening for other suspected endocrine disorder: Secondary | ICD-10-CM

## 2023-11-10 DIAGNOSIS — Z130 Encounter for screening for diseases of the blood and blood-forming organs and certain disorders involving the immune mechanism: Secondary | ICD-10-CM

## 2023-11-10 MED ORDER — FLUTICASONE-SALMETEROL 250-50 MCG/ACT IN AEPB: 1.0000 | INHALATION_SPRAY | Freq: Two times a day (BID) | RESPIRATORY_TRACT | 2 refills | Status: AC

## 2023-11-10 NOTE — Assessment & Plan Note (Signed)
Problem is well-controlled. Continue Advair 250-50 mcg twice daily as needed. Recommend stopping Singulair, which she is not taking frequently. Start Zyrtec 10 mg daily as needed.

## 2023-11-10 NOTE — Assessment & Plan Note (Signed)
Due to DUB, following with gyn. she is not on iron supplementation. Colonoscopy in 09/2022. Further recommendation will be given according to CBC and iron studies results.

## 2023-11-10 NOTE — Assessment & Plan Note (Signed)
Non pharmacologic treatment recommended for now. Further recommendations will be given according to 10 years CVD risk score and lipid panel numbers.

## 2023-11-10 NOTE — Patient Instructions (Addendum)
A few things to remember from today's visit:  Routine general medical examination at a health care facility  Vitamin D deficiency, unspecified - Plan: Comprehensive metabolic panel, VITAMIN D 25 Hydroxy (Vit-D Deficiency, Fractures)  Hyperlipidemia, unspecified hyperlipidemia type - Plan: Lipid panel, Comprehensive metabolic panel  Iron deficiency anemia due to chronic blood loss  Screening for endocrine, metabolic and immunity disorder - Plan: Hemoglobin A1c, Comprehensive metabolic panel  Iron deficiency anemia, unspecified iron deficiency anemia type - Plan: Ferritin, CBC with Differential/Platelet, Iron  Hypokalemia - Plan: Magnesium, Comprehensive metabolic panel  If you need refills for medications you take chronically, please call your pharmacy. Do not use My Chart to request refills or for acute issues that need immediate attention. If you send a my chart message, it may take a few days to be addressed, specially if I am not in the office.  Please be sure medication list is accurate. If a new problem present, please set up appointment sooner than planned today.  Health Maintenance, Female Adopting a healthy lifestyle and getting preventive care are important in promoting health and wellness. Ask your health care provider about: The right schedule for you to have regular tests and exams. Things you can do on your own to prevent diseases and keep yourself healthy. What should I know about diet, weight, and exercise? Eat a healthy diet  Eat a diet that includes plenty of vegetables, fruits, low-fat dairy products, and lean protein. Do not eat a lot of foods that are high in solid fats, added sugars, or sodium. Maintain a healthy weight Body mass index (BMI) is used to identify weight problems. It estimates body fat based on height and weight. Your health care provider can help determine your BMI and help you achieve or maintain a healthy weight. Get regular exercise Get  regular exercise. This is one of the most important things you can do for your health. Most adults should: Exercise for at least 150 minutes each week. The exercise should increase your heart rate and make you sweat (moderate-intensity exercise). Do strengthening exercises at least twice a week. This is in addition to the moderate-intensity exercise. Spend less time sitting. Even light physical activity can be beneficial. Watch cholesterol and blood lipids Have your blood tested for lipids and cholesterol at 47 years of age, then have this test every 5 years. Have your cholesterol levels checked more often if: Your lipid or cholesterol levels are high. You are older than 47 years of age. You are at high risk for heart disease. What should I know about cancer screening? Depending on your health history and family history, you may need to have cancer screening at various ages. This may include screening for: Breast cancer. Cervical cancer. Colorectal cancer. Skin cancer. Lung cancer. What should I know about heart disease, diabetes, and high blood pressure? Blood pressure and heart disease High blood pressure causes heart disease and increases the risk of stroke. This is more likely to develop in people who have high blood pressure readings or are overweight. Have your blood pressure checked: Every 3-5 years if you are 56-11 years of age. Every year if you are 94 years old or older. Diabetes Have regular diabetes screenings. This checks your fasting blood sugar level. Have the screening done: Once every three years after age 42 if you are at a normal weight and have a low risk for diabetes. More often and at a younger age if you are overweight or have a high risk for  diabetes. What should I know about preventing infection? Hepatitis B If you have a higher risk for hepatitis B, you should be screened for this virus. Talk with your health care provider to find out if you are at risk for  hepatitis B infection. Hepatitis C Testing is recommended for: Everyone born from [birth year] through 1965. Anyone with known risk factors for hepatitis C. Sexually transmitted infections (STIs) Get screened for STIs, including gonorrhea and chlamydia, if: You are sexually active and are younger than 47 years of age. You are older than 47 years of age and your health care provider tells you that you are at risk for this type of infection. Your sexual activity has changed since you were last screened, and you are at increased risk for chlamydia or gonorrhea. Ask your health care provider if you are at risk. Ask your health care provider about whether you are at high risk for HIV. Your health care provider may recommend a prescription medicine to help prevent HIV infection. If you choose to take medicine to prevent HIV, you should first get tested for HIV. You should then be tested every 3 months for as long as you are taking the medicine. Pregnancy If you are about to stop having your period (premenopausal) and you may become pregnant, seek counseling before you get pregnant. Take 400 to 800 micrograms (mcg) of folic acid every day if you become pregnant. Ask for birth control (contraception) if you want to prevent pregnancy. Osteoporosis and menopause Osteoporosis is a disease in which the bones lose minerals and strength with aging. This can result in bone fractures. If you are 36 years old or older, or if you are at risk for osteoporosis and fractures, ask your health care provider if you should: Be screened for bone loss. Take a calcium or vitamin D supplement to lower your risk of fractures. Be given hormone replacement therapy (HRT) to treat symptoms of menopause. Follow these instructions at home: Alcohol use Do not drink alcohol if: Your health care provider tells you not to drink. You are pregnant, may be pregnant, or are planning to become pregnant. If you drink alcohol: Limit how much  you have to: 0-1 drink a day. Know how much alcohol is in your drink. In the [place] one drink equals one 12 oz bottle of beer (355 mL), one 5 oz glass of wine (148 mL), or one 1 oz glass of hard liquor (44 mL). Lifestyle Do not use any products that contain nicotine or tobacco. These products include cigarettes, chewing tobacco, and vaping devices, such as e-cigarettes. If you need help quitting, ask your health care provider. Do not use street drugs. Do not share needles. Ask your health care provider for help if you need support or information about quitting drugs. General instructions Schedule regular health, dental, and eye exams. Stay current with your vaccines. Tell your health care provider if: You often feel depressed. You have ever been abused or do not feel safe at home. Summary Adopting a healthy lifestyle and getting preventive care are important in promoting health and wellness. Follow your health care provider's instructions about healthy diet, exercising, and getting tested or screened for diseases. Follow your health care provider's instructions on monitoring your cholesterol and blood pressure. This information is not intended to replace advice given to you by your health care provider. Make sure you discuss any questions you have with your health care provider. Document Revised: 04/22/2021 Document Reviewed: 04/22/2021 Elsevier Patient Education  2024  [employer].

## 2023-11-10 NOTE — Assessment & Plan Note (Signed)
We discussed the importance of regular physical activity and healthy diet for prevention of chronic illness and/or complications. Preventive guidelines reviewed. Vaccination up-to-date.  Declined flu vaccine. Continue her female preventive care with gynecologist. Next CPE in a year.

## 2023-11-10 NOTE — Assessment & Plan Note (Signed)
Continue current dose of vitamin D supplementation. Further recommendation will be given according to 25 OH vitamin D result. 

## 2023-11-11 ENCOUNTER — Other Ambulatory Visit (INDEPENDENT_AMBULATORY_CARE_PROVIDER_SITE_OTHER): Payer: BC Managed Care – PPO

## 2023-11-11 DIAGNOSIS — Z130 Encounter for screening for diseases of the blood and blood-forming organs and certain disorders involving the immune mechanism: Secondary | ICD-10-CM | POA: Diagnosis not present

## 2023-11-11 DIAGNOSIS — Z8781 Personal history of (healed) traumatic fracture: Secondary | ICD-10-CM

## 2023-11-11 DIAGNOSIS — Z13228 Encounter for screening for other metabolic disorders: Secondary | ICD-10-CM | POA: Diagnosis not present

## 2023-11-11 DIAGNOSIS — E559 Vitamin D deficiency, unspecified: Secondary | ICD-10-CM | POA: Diagnosis not present

## 2023-11-11 DIAGNOSIS — E785 Hyperlipidemia, unspecified: Secondary | ICD-10-CM

## 2023-11-11 DIAGNOSIS — Z1329 Encounter for screening for other suspected endocrine disorder: Secondary | ICD-10-CM | POA: Diagnosis not present

## 2023-11-11 DIAGNOSIS — D500 Iron deficiency anemia secondary to blood loss (chronic): Secondary | ICD-10-CM

## 2023-11-11 DIAGNOSIS — E876 Hypokalemia: Secondary | ICD-10-CM

## 2023-11-11 LAB — LIPID PANEL
Cholesterol: 193 mg/dL (ref 0–200)
HDL: 68.6 mg/dL (ref >39.00)
LDL Cholesterol: 114 mg/dL — ABNORMAL HIGH (ref 0–99)
NonHDL: 124.63
Total CHOL/HDL Ratio: 3
Triglycerides: 54 mg/dL (ref 0.0–149.0)
VLDL: 10.8 mg/dL (ref 0.0–40.0)

## 2023-11-11 LAB — COMPREHENSIVE METABOLIC PANEL
ALT: 14 U/L (ref 0–35)
AST: 13 U/L (ref 0–37)
Albumin: 4.3 g/dL (ref 3.5–5.2)
Alkaline Phosphatase: 93 U/L (ref 39–117)
BUN: 6 mg/dL (ref 6–23)
CO2: 28 meq/L (ref 19–32)
Calcium: 9 mg/dL (ref 8.4–10.5)
Chloride: 103 meq/L (ref 96–112)
Creatinine, Ser: 0.54 mg/dL (ref 0.40–1.20)
GFR: 109.91 mL/min (ref >60.00)
Glucose, Bld: 98 mg/dL (ref 70–99)
Potassium: 3.6 meq/L (ref 3.5–5.1)
Sodium: 137 meq/L (ref 135–145)
Total Bilirubin: 0.6 mg/dL (ref 0.2–1.2)
Total Protein: 7.2 g/dL (ref 6.0–8.3)

## 2023-11-11 LAB — HEMOGLOBIN A1C: Hgb A1c MFr Bld: 6.4 % (ref 4.6–6.5)

## 2023-11-11 LAB — CBC WITH DIFFERENTIAL/PLATELET
Basophils Absolute: 0 10*3/uL (ref 0.0–0.1)
Basophils Relative: 0.8 % (ref 0.0–3.0)
Eosinophils Absolute: 0.1 10*3/uL (ref 0.0–0.7)
Eosinophils Relative: 1.1 % (ref 0.0–5.0)
HCT: 34.2 % — ABNORMAL LOW (ref 36.0–46.0)
Hemoglobin: 10.7 g/dL — ABNORMAL LOW (ref 12.0–15.0)
Lymphocytes Relative: 34.2 % (ref 12.0–46.0)
Lymphs Abs: 1.7 10*3/uL (ref 0.7–4.0)
MCHC: 31.4 g/dL (ref 30.0–36.0)
MCV: 73.2 fL — ABNORMAL LOW (ref 78.0–100.0)
Monocytes Absolute: 0.3 10*3/uL (ref 0.1–1.0)
Monocytes Relative: 5.8 % (ref 3.0–12.0)
Neutro Abs: 2.9 10*3/uL (ref 1.4–7.7)
Neutrophils Relative %: 58.1 % (ref 43.0–77.0)
Platelets: 483 10*3/uL — ABNORMAL HIGH (ref 150.0–400.0)
RBC: 4.67 Mil/uL (ref 3.87–5.11)
RDW: 17.2 % — ABNORMAL HIGH (ref 11.5–15.5)
WBC: 5.1 10*3/uL (ref 4.0–10.5)

## 2023-11-11 LAB — VITAMIN D 25 HYDROXY (VIT D DEFICIENCY, FRACTURES): VITD: 11.6 ng/mL — ABNORMAL LOW (ref 30.00–100.00)

## 2023-11-11 LAB — MAGNESIUM: Magnesium: 2.1 mg/dL (ref 1.5–2.5)

## 2023-11-11 LAB — IRON: Iron: 27 ug/dL — ABNORMAL LOW (ref 42–145)

## 2023-11-11 LAB — FERRITIN: Ferritin: 4.8 ng/mL — ABNORMAL LOW (ref 10.0–291.0)

## 2023-11-11 MED ORDER — VITAMIN D (ERGOCALCIFEROL) 1.25 MG (50000 UNIT) PO CAPS: 50000.0000 [IU] | ORAL_CAPSULE | ORAL | 0 refills | Status: AC

## 2024-01-25 ENCOUNTER — Encounter: Payer: Self-pay | Admitting: Family Medicine

## 2024-01-25 ENCOUNTER — Ambulatory Visit (INDEPENDENT_AMBULATORY_CARE_PROVIDER_SITE_OTHER): Payer: Self-pay | Admitting: Family Medicine

## 2024-01-25 VITALS — BP 145/80 | HR 95 | Temp 99.4°F | Resp 12 | Ht 64.0 in | Wt 264.0 lb

## 2024-01-25 DIAGNOSIS — R7303 Prediabetes: Secondary | ICD-10-CM | POA: Diagnosis not present

## 2024-01-25 DIAGNOSIS — M79662 Pain in left lower leg: Secondary | ICD-10-CM | POA: Diagnosis not present

## 2024-01-25 DIAGNOSIS — I10 Essential (primary) hypertension: Secondary | ICD-10-CM | POA: Diagnosis not present

## 2024-01-25 DIAGNOSIS — R600 Localized edema: Secondary | ICD-10-CM | POA: Insufficient documentation

## 2024-01-25 DIAGNOSIS — Z6841 Body Mass Index (BMI) 40.0 and over, adult: Secondary | ICD-10-CM

## 2024-01-25 LAB — POCT GLYCOSYLATED HEMOGLOBIN (HGB A1C): HbA1c, POC (prediabetic range): 6.1 % (ref 5.7–6.4)

## 2024-01-25 MED ORDER — CHLORTHALIDONE 25 MG PO TABS: 25.0000 mg | ORAL_TABLET | Freq: Every day | ORAL | 0 refills | Status: DC

## 2024-01-25 NOTE — Assessment & Plan Note (Signed)
 BP is not well controlled. Possible complications of elevated BP discussed. We discussed a few pharmacologic treatments and some side effects. Because also c/o LE edema, recommend Chlorthalidone  25 mg daily. Low salt/DASH diet. Monitor BP at home. BMP in 3 weeks. Follow-up in 2 months.

## 2024-01-25 NOTE — Assessment & Plan Note (Signed)
 She has gained some wt since her last visit in 10/2023. Patient understands the benefits of wt loss as well as adverse effects of obesity. Consistency with healthy diet and physical activity encouraged. She would like an appt with nutritionist, referral placed.

## 2024-01-25 NOTE — Assessment & Plan Note (Signed)
 HgA1C improved, it went from 6.4 to 6.1. Encouraged consistency with a healthy life style for diabetes prevention.

## 2024-01-25 NOTE — Patient Instructions (Addendum)
 A few things to remember from today's visit:  Essential hypertension - Plan: Amb Referral to Nutrition and Diabetic Education, chlorthalidone  (HYGROTON ) 25 MG tablet  Asthma, intermittent, uncomplicated  Prediabetes - Plan: Amb Referral to Nutrition and Diabetic Education, POC HgB A1c  Pain of left calf - Plan: VAS US  LOWER EXTREMITY VENOUS (DVT)  Lower extremity edema  Monitor blood pressure at home, goal is under 130/80. Chlorthalidone  once daily. Lab in 2-3 weeks.  If you need refills for medications you take chronically, please call your pharmacy. Do not use My Chart to request refills or for acute issues that need immediate attention. If you send a my chart message, it may take a few days to be addressed, specially if I am not in the office.  Please be sure medication list is accurate. If a new problem present, please set up appointment sooner than planned today.

## 2024-01-25 NOTE — Assessment & Plan Note (Signed)
 L>R. We discussed possible etiologies. Hx suggest venous insufficiency. Left calf pain and tightness, so venous US  ordered. LE elevation. Chlorthalidone  for BP management started today.

## 2024-01-25 NOTE — Progress Notes (Addendum)
HPI: [name] is a 49 y.o. female with a PMHx significant for HTN, asthma, GERD, CIN II, HPV infection, HLD, and iron deficiency anemia, among others, who is here today for chronic disease management.  Last seen on 11/10/2023  Hypertension:  Medications: Not currently on pharmacologic treatment.  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 The last time she was at the gynecologist, her BP was 170/80s. Today, her BP is 150/84.  Diet: She is eating all fast food because she doesn't have space to cook.  Exercise: She has not been exercising, but she recently signed up for a gym membership. Vision: She had an eye exam last year.   She mentions she has had LE edema with the left leg usually being worse than the right. The worst this swelling has been was on 2/7.  Denies recent travel, leg redness, or leg pain.  No hx of trauma. Problem seems to be worse at the end of the day.  Also states she has had some SOB with exertion, but no chest pain with exertion. She attributes symptom to nasal congestion.  She has not been told she is having sleep apnea, but mentions she is often fatigued in the morning.   Negative for unusual or severe headache, visual changes,  palpitations, or focal weakness. Lab Results  Component Value Date   CREATININE 0.54 11/11/2023   BUN 6 11/11/2023   NA 137 11/11/2023   K 3.6 11/11/2023   CL 103 11/11/2023   CO2 28 11/11/2023   Prediabetes: Negative for polyphagia,polydipsia,and polyuria.  Lab Results  Component Value Date   HGBA1C 6.4 11/11/2023   Review of Systems  Constitutional:  Positive for fatigue. Negative for activity change, appetite change and fever.  HENT:  Negative for mouth sores, nosebleeds and sore throat.   Respiratory:  Negative for cough and wheezing.   Gastrointestinal:  Negative for abdominal pain, nausea and vomiting.        Negative for changes in bowel habits.  Endocrine: Negative for cold intolerance and heat intolerance.  Genitourinary:  Negative for decreased urine volume, dysuria and hematuria.  Skin:  Negative for rash.  Neurological:  Negative for syncope and facial asymmetry.  Psychiatric/Behavioral:  Negative for confusion and hallucinations.   See other pertinent positives and negatives in HPI.  Current Outpatient Medications on File Prior to Visit  Medication Sig Dispense Refill   albuterol (VENTOLIN HFA) 108 (90 Base) MCG/ACT inhaler Inhale 2 puffs into the lungs every 6 (six) hours as needed for wheezing or shortness of breath. 8 g 0   ferrous sulfate 325 (65 FE) MG tablet Take 1 tablet (325 mg total) by mouth daily. 90 tablet 2   fluticasone (FLONASE) 50 MCG/ACT nasal spray Place 1 spray into both nostrils 2 (two) times daily. 16 g 3   fluticasone-salmeterol (ADVAIR) 250-50 MCG/ACT AEPB Inhale 1 puff into the lungs in the morning and at bedtime. 60 each 2   nystatin-triamcinolone (MYCOLOG II) cream Apply 1 Application topically 2 (two) times daily as needed. 45 g 0   omeprazole (PRILOSEC) 20 MG capsule Take 20 mg by mouth daily. Unsure of strength     Vitamin D, Ergocalciferol, (DRISDOL) 1.25 MG (50000 UNIT) CAPS capsule Take 1 capsule (50,000 Units total) by mouth every 7 (seven) days for 12 doses. 12 capsule 0   No current facility-administered medications on file prior to  visit.   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   HSV (herpes simplex virus) infection    Hypertension    Lactose intolerance     Migraines    Pre-diabetes    Sebaceous cyst of ear    right ear   No Known Allergies  Social History   Socioeconomic History   Marital status: Single    Spouse name: Not on file   Number of children: 1   Years of education: Not on file   Highest education level: Not on file  Occupational History   Occupation: Engineer, site  Tobacco Use   Smoking status: Never   Smokeless tobacco: Never  Vaping Use   Vaping status: Never Used  Substance and Sexual Activity   Alcohol use: Not Currently   Drug use: No   Sexual activity: Yes    Partners: Male  Other Topics Concern   Not on file  Social History Narrative   Not on file   Social Drivers of Health   Financial Resource Strain: Not on file  Food Insecurity: Not on file  Transportation Needs: Not on file  Physical Activity: Not on file  Stress: Not on file  Social Connections: Not on file   Vitals:   01/25/24 1403 01/25/24 2216  BP: (!) 150/84 (!) 145/80  Pulse: 95   Resp: 12   Temp: 99.4 F (37.4 C)   SpO2: 98%    Wt Readings from Last 3 Encounters:  01/25/24 264 lb (119.7 kg)  11/10/23 259 lb (117.5 kg)  06/15/23 251 lb 4 oz (114 kg)   Body mass index is 45.32 kg/m.  Physical Exam Vitals and nursing note reviewed.  Constitutional:      General: She is not in acute distress.    Appearance: She is well-developed.  HENT:     Head: Normocephalic and atraumatic.     Mouth/Throat:     Mouth: Mucous membranes are moist.     Pharynx: Oropharynx is clear. Uvula midline.  Eyes:     Conjunctiva/sclera: Conjunctivae normal.  Cardiovascular:     Rate and Rhythm: Normal rate and regular rhythm.     Pulses:          Dorsalis pedis pulses are 2+ on the right side and 2+ on the left side.     Heart sounds: No murmur heard.    Comments: Left calf pain with foot dorsiflexion and palpation. Calf f tightness. No erythema. Varicose veins L>R. Pulmonary:     Effort: Pulmonary effort is normal. No respiratory distress.      Breath sounds: Normal breath sounds. No wheezing.  Abdominal:     Palpations: Abdomen is soft. There is no mass.     Tenderness: There is no abdominal tenderness.  Musculoskeletal:     Right lower leg: Edema (trace) present.     Left lower leg: 1+ Edema present.  Lymphadenopathy:     Cervical: No cervical adenopathy.  Skin:    General:  Skin is warm.     Findings: No erythema or rash.  Neurological:     General: No focal deficit present.     Mental Status: She is alert and oriented to person, place, and time.     Cranial Nerves: No cranial nerve deficit.     Gait: Gait normal.  Psychiatric:        Mood and Affect: Mood and affect normal.    ASSESSMENT AND PLAN:  Ms. Blumberg was seen today for chronic disease management.   Orders Placed This Encounter  Procedures   Basic metabolic panel   Amb Referral to Nutrition and Diabetic Education   POC HgB A1c   VAS [place] LOWER EXTREMITY VENOUS (DVT)   Pain of left calf Reports LLE edema, noted tenderness with palpation and foot dorsiflexion. Worse 2-3 days ago. We discussed possible causes. I think it is warranted to evaluate for DVT, venous US ASAP ordered. Instructed about warning signs.  -     VAS [place] LOWER EXTREMITY VENOUS (DVT); Future  Prediabetes Assessment & Plan: HgA1C improved, it went from 6.4 to 6.1. Encouraged consistency with a healthy life style for diabetes prevention.  Orders: -     Amb Referral to Nutrition and Diabetic Education -     POCT glycosylated hemoglobin (Hb A1C)  Essential hypertension Assessment & Plan: BP is not well controlled. Possible complications of elevated BP discussed. We discussed a few pharmacologic treatments and some side effects. Because also c/o LE edema, recommend Chlorthalidone 25 mg daily. Low salt/DASH diet. Monitor BP at home. BMP in 3 weeks. Follow-up in 2 months.  Orders: -     Amb Referral to Nutrition and Diabetic Education -     Chlorthalidone; Take 1 tablet (25  mg total) by mouth daily.  Dispense: 90 tablet; Refill: 0 -     Basic metabolic panel; Future  Lower extremity edema Assessment & Plan: L>R. We discussed possible etiologies. Hx suggest venous insufficiency. Left calf pain and tightness, so venous US ordered. LE elevation. Chlorthalidone for BP management started today.  Orders: -     Basic metabolic panel; Future  Morbid obesity with BMI of 45.0-49.9, adult [facility]) Assessment & Plan: She has gained some wt since her last visit in 10/2023. Patient understands the benefits of wt loss as well as adverse effects of obesity. Consistency with healthy diet and physical activity encouraged. She would like an appt with nutritionist, referral placed.  I spent a total of 40 minutes in both face to face and non face to face activities for this visit on the date of this encounter. During this time history was obtained and documented, examination was performed, prior labs reviewed, and assessment/plan discussed.  Return in about 2 months (around 03/24/2024).  I, Rolla Etienne Wierda, acting as a scribe for Lillymae Duet [place], MD., have documented all relevant documentation on the behalf of Lynn Lisle [place], MD, as directed by  Lynn Haston [place], MD while in the presence of Lynn Bille [place], MD.   I, Lynn Wilensky [place], MD, have reviewed all documentation for this visit. The documentation on 01/25/24 for the exam, diagnosis, procedures, and orders are all accurate and complete.  Lynn Ohlson G. [place], MD  [facility]. [place] office.

## 2024-01-27 ENCOUNTER — Ambulatory Visit (HOSPITAL_COMMUNITY)
Admission: RE | Admit: 2024-01-27 | Discharge: 2024-01-27 | Disposition: A | Discharge status: Home / Self Care | Payer: 59 | Source: Ambulatory Visit | Attending: Family Medicine | Admitting: Family Medicine

## 2024-01-27 ENCOUNTER — Encounter: Payer: Self-pay | Admitting: Family Medicine

## 2024-01-27 DIAGNOSIS — M79662 Pain in left lower leg: Secondary | ICD-10-CM | POA: Diagnosis present

## 2024-02-15 ENCOUNTER — Other Ambulatory Visit: Payer: 59

## 2024-03-10 ENCOUNTER — Ambulatory Visit: Payer: 59 | Admitting: Dietician

## 2024-04-11 ENCOUNTER — Ambulatory Visit: Payer: Self-pay

## 2024-04-11 NOTE — Telephone Encounter (Signed)
 Chief Complaint: Headache Symptoms: Headache 5/10, vomiting x 4 yesterday, diarrhea yesterday, body ache Frequency: Constant  Pertinent Negatives: Patient denies fever, blood in stool, migraine headache Disposition: [] ED /[] Urgent Care (no appt availability in office) / [x] Appointment(In office/virtual)/ []  [facility]/ [] Home Care/ [] Refused Recommended Disposition /[] [place] [facility]/ []  Follow-up with PCP Additional Notes: Patient states she had diarrhea all day yesterday until about 0400 this morning, patient reports vomiting x4 yesterday. Patient states she continues to have a moderate headache and body aches today. Care advice was given and patient offered an appointment today but patient declined stating she feels too weak to come in today but would like to be seen tomorrow. Patient was scheduled with PCP tomorrow afternoon per patient's request.   Copied from CRM [id number]. Topic: Clinical - Red Word Triage >> Apr 11, 2024  1:25 PM Freya Jesus wrote: Red Word that prompted transfer to Nurse Triage: Patient called stated she may have a stomach bug, threw up yesterday and had diarrhea. Stated her body hurts and she has a headache. Reason for Disposition  [1] MODERATE headache (e.g., interferes with normal activities) AND [2] present > 24 hours AND [3] unexplained  (Exceptions: analgesics not tried, typical migraine, or headache part of viral illness)  Answer Assessment - Initial Assessment Questions 1. LOCATION: "Where does it hurt?"      All over, towards the front  2. ONSET: "When did the headache start?" (Minutes, hours or days)      0900 yesterday  3. PATTERN: "Does the pain come and go, or has it been constant since it started?"     Comes and goes  4. SEVERITY: "How bad is the pain?" and "What does it keep you from doing?"  (e.g., Scale 1-10; mild, moderate, or severe)   - MILD (1-3): doesn't interfere with normal activities    - MODERATE (4-7): interferes with normal  activities or awakens from sleep    - SEVERE (8-10): excruciating pain, unable to do any normal activities        5/10 5. RECURRENT SYMPTOM: "Have you ever had headaches before?" If Yes, ask: "When was the last time?" and "What happened that time?"      No  6. CAUSE: "What do you think is causing the headache?"     I don't feel good  7. MIGRAINE: "Have you been diagnosed with migraine headaches?" If Yes, ask: "Is this headache similar?"      No  8. HEAD INJURY: "Has there been any recent injury to the head?"      No  9. OTHER SYMPTOMS: "Do you have any other symptoms?" (fever, stiff neck, eye pain, sore throat, cold symptoms)     Vomiting x 4 yesterday, diarrhea all day yesterday, body aches, cough 10. PREGNANCY: "Is there any chance you are pregnant?" "When was your last menstrual period?"       N/A  Protocols used: Headache-A-AH

## 2024-04-12 ENCOUNTER — Encounter: Payer: Self-pay | Admitting: Family Medicine

## 2024-04-12 ENCOUNTER — Ambulatory Visit: Admitting: Family Medicine

## 2024-04-12 ENCOUNTER — Encounter (INDEPENDENT_AMBULATORY_CARE_PROVIDER_SITE_OTHER): Payer: Self-pay

## 2024-04-12 VITALS — BP 170/90 | HR 77 | Temp 98.8°F | Resp 12 | Ht 64.0 in | Wt 256.4 lb

## 2024-04-12 DIAGNOSIS — I10 Essential (primary) hypertension: Secondary | ICD-10-CM

## 2024-04-12 DIAGNOSIS — K529 Noninfective gastroenteritis and colitis, unspecified: Secondary | ICD-10-CM

## 2024-04-12 DIAGNOSIS — E1169 Type 2 diabetes mellitus with other specified complication: Secondary | ICD-10-CM

## 2024-04-12 DIAGNOSIS — R112 Nausea with vomiting, unspecified: Secondary | ICD-10-CM

## 2024-04-12 DIAGNOSIS — E876 Hypokalemia: Secondary | ICD-10-CM

## 2024-04-12 DIAGNOSIS — R7303 Prediabetes: Secondary | ICD-10-CM | POA: Diagnosis not present

## 2024-04-12 DIAGNOSIS — Z6841 Body Mass Index (BMI) 40.0 and over, adult: Secondary | ICD-10-CM

## 2024-04-12 MED ORDER — ONDANSETRON 4 MG PO TBDP: 4.0000 mg | ORAL_TABLET | Freq: Two times a day (BID) | ORAL | 0 refills | Status: AC | PRN

## 2024-04-12 NOTE — Assessment & Plan Note (Signed)
 BP elevated today. She has not taken her chlorthalidone  for the past 4 days, we discussed possible complications of elevated BP. She reporting adequate BP readings while taking her antihypertensive medication daily. Resume chlorthalidone  25 mg daily. Low-salt diet recommended.

## 2024-04-12 NOTE — Progress Notes (Signed)
 ACUTE VISIT Chief Complaint  Patient presents with   Headache   Emesis   Diarrhea   Generalized Body Aches    Symptoms started Saturday and Sunday   HPI: [name] is a 48 y.o. female with a PMHx significant for HTN, asthma, GERD, CIN II, HPV infection, HLD, and iron deficiency anemia, among others, who is here today with above symptoms.  Frontal headache, body aches, and nonproductive cough starting 4/26 and nausea, vomiting, and diarrhea starting on 4/27.  Also reports some upper middle chest pain today, nonradiating, while she was resting. Diarrhea  This is a new problem. The current episode started in the past 7 days. The problem has been gradually improving. The patient states that diarrhea does not awaken her from sleep. Risk factors include ill contacts. She has tried nothing for the symptoms.  She last vomited yesterday at 430 am.  She has only had one stool today and it was watery.  Reports some nausea after eating a sandwich yesterday.  Her urine output has been slightly diminished.  She has been taking tylenol for pain.  Has not checked for Covid at home.  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  Pertinent negatives include fever, chills, nasal congestion, sore throat, rhinorrhea, or abdominal pain.    Hypertension:  Medications: She has not been taking her chlorthalidone  25 mg since 4/25. BP readings at home: She has her BP checked by the school nurse twice per week. She says the highest it has been lately has been 128/82.  Negative for unusual or severe headache, visual changes, dyspnea,  focal weakness, or edema.  Lab Results  Component Value Date   CREATININE 0.54 11/11/2023   BUN 6 11/11/2023   NA 137 11/11/2023   K 3.6 11/11/2023   CL 103 11/11/2023   CO2 28 11/11/2023   Prediabetes: Negative for polydipsia, polyphagia, or polyuria. Lab Results  Component Value Date   HGBA1C 6.1 01/25/2024   She  has not been exercising regularly and has not been consistent with following a healthful diet.  LMP: beginning of April  Review of Systems  Constitutional:  Positive for activity change, appetite change and fatigue.  HENT:  Negative for sore throat and voice change.   Respiratory:  Negative for wheezing.   Gastrointestinal:  Negative for abdominal distention and blood in stool.  Endocrine: Negative for cold intolerance and heat intolerance.  Genitourinary:  Negative for decreased urine volume, dysuria and hematuria.  Skin:  Negative for rash.  Neurological:  Negative for syncope and facial asymmetry.  Psychiatric/Behavioral:  Negative for confusion.   See other pertinent positives and negatives in HPI.  Current Outpatient Medications on File Prior to Visit  Medication Sig Dispense Refill   albuterol  (VENTOLIN  HFA) 108 (90 Base) MCG/ACT inhaler Inhale 2 puffs into the lungs every 6 (six) hours as needed for wheezing or shortness of breath. 8 g 0   chlorthalidone  (HYGROTON ) 25 MG tablet Take 1 tablet (25 mg total) by mouth daily. 90 tablet 0   ferrous sulfate  325 (65 FE) MG tablet Take 1 tablet (325 mg total) by mouth daily. 90 tablet 2   fluticasone  (FLONASE ) 50 MCG/ACT nasal spray Place 1 spray into both nostrils 2 (two) times daily. 16 g 3   fluticasone -salmeterol (ADVAIR) 250-50 MCG/ACT AEPB Inhale 1 puff into the lungs in the morning and at bedtime. 60 each 2   nystatin -triamcinolone  (MYCOLOG II) cream Apply 1 Application  topically 2 (two) times daily as needed. 45 g 0   omeprazole  (PRILOSEC) 20 MG capsule Take 20 mg by mouth daily. Unsure of strength     No current facility-administered medications on file prior to visit.    Past Medical History:  Diagnosis Date   Allergies    Allergy     Anemia    Anxiety    Asthma    Back pain    Blood transfusion without reported diagnosis    Cardiomegaly    CIN I (cervical intraepithelial neoplasia I) 10/2000   C&B   CIN II (cervical  intraepithelial neoplasia II) 08/2002   LEEP----MULTIPLE FOCI OF ENDOCERVICAL MARGINS   Constipation    Depression    Fibroid    Foot pain    High risk HPV infection 03/2007   HSV (herpes simplex virus) infection    Hypertension    Lactose intolerance    Migraines    Pre-diabetes    Sebaceous cyst of ear    right ear   No Known Allergies  Social History   Socioeconomic History   Marital status: Single    Spouse name: Not on file   Number of children: 1   Years of education: Not on file   Highest education level: Not on file  Occupational History   Occupation: Engineer, site  Tobacco Use   Smoking status: Never   Smokeless tobacco: Never  Vaping Use   Vaping status: Never Used  Substance and Sexual Activity   Alcohol use: Not Currently   Drug use: No   Sexual activity: Yes    Partners: Male  Other Topics Concern   Not on file  Social History Narrative   Not on file   Social Drivers of Health   Financial Resource Strain: Not on file  Food Insecurity: Not on file  Transportation Needs: Not on file  Physical Activity: Not on file  Stress: Not on file  Social Connections: Not on file   Vitals:   04/12/24 1504 04/12/24 1515  BP: (!) 160/96 (!) 170/90  Pulse: 77   Resp: 12   Temp: 98.8 F (37.1 C)   SpO2: 97%    Body mass index is 44.01 kg/m.  Physical Exam Vitals and nursing note reviewed.  Constitutional:      General: She is not in acute distress.    Appearance: She is well-developed.  HENT:     Head: Normocephalic and atraumatic.     Mouth/Throat:     Mouth: Mucous membranes are moist.     Pharynx: Oropharynx is clear.  Eyes:     Conjunctiva/sclera: Conjunctivae normal.  Cardiovascular:     Rate and Rhythm: Regular rhythm.     Pulses:          Dorsalis pedis pulses are 2+ on the right side and 2+ on the left side.     Heart sounds: No murmur heard. Pulmonary:     Effort: Pulmonary effort is normal. No respiratory distress.     Breath  sounds: Normal breath sounds.  Abdominal:     Palpations: Abdomen is soft. There is no hepatomegaly or mass.     Tenderness: There is no abdominal tenderness.  Musculoskeletal:     Right lower leg: No edema.     Left lower leg: No edema.  Lymphadenopathy:     Cervical: No cervical adenopathy.  Skin:    General: Skin is warm.     Findings: No erythema or rash.  Neurological:  General: No focal deficit present.     Mental Status: She is alert and oriented to person, place, and time.     Cranial Nerves: No cranial nerve deficit.     Gait: Gait normal.  Psychiatric:        Mood and Affect: Mood and affect normal.   ASSESSMENT AND PLAN:  Ms. Ferrier was seen today for headache, vomiting, diarrhea, and body aches.  Lab Results  Component Value Date   NA 135 04/12/2024   CL 98 04/12/2024   K 2.9 (L) 04/12/2024   CO2 28 04/12/2024   BUN 9 04/12/2024   CREATININE 0.63 04/12/2024   GFR 105.59 04/12/2024   CALCIUM 8.8 04/12/2024   ALBUMIN 4.3 11/11/2023   GLUCOSE 88 04/12/2024   Lab Results  Component Value Date   HGBA1C 6.8 (H) 04/12/2024   Prediabetes Assessment & Plan: Last hemoglobin A1c 6.1 in 01/2024. Encouraged consistency with a healthy lifestyle. Further recommendation will be given according to lab results.  Orders: -     Hemoglobin A1c; Future  Nausea and vomiting in adult She has not had vomiting since yesterday at 4:30 AM. Zofran  4 mg twice daily as needed recommended. Advance diet as tolerated. Continue adequate hydration.  -     Ondansetron ; Take 1 tablet (4 mg total) by mouth every 12 (twelve) hours as needed for up to 5 days for nausea or vomiting.  Dispense: 10 tablet; Refill: 0  Essential hypertension Assessment & Plan: BP elevated today. She has not taken her chlorthalidone  for the past 4 days, we discussed possible complications of elevated BP. She reporting adequate BP readings while taking her antihypertensive medication daily. Resume  chlorthalidone  25 mg daily. Low-salt diet recommended.  Orders: -     Basic metabolic panel with GFR; Future  Acute gastroenteritis Most likely viral etiology, so symptomatic treatment recommended for now.Other possible causes discussed but at this time I do not think further work-up is necessary.  OTC Imodium could be used but she just having 1-2 stools per day, so I will continue as needed at this time. Oral hydration, water mixed with Gatorade or Pedialyte are good options. Bland and light diet if tolerated. Clearly instructed about warning signs. F/U as needed.  Morbid obesity with BMI of 45.0-49.9, adult [facility]) Assessment & Plan: She understands the benefits of wt loss as well as adverse effects of obesity. Consistency with healthy diet and physical activity encouraged. She is interested in referral to healthy weight and wellness clinic, referral placed.  Orders: -     Amb Ref to Medical Weight Management  Return if symptoms worsen or fail to improve, for keep next appointment.  I, Lynn Hansen, acting as a scribe for Lynn Leipold [place], MD., have documented all relevant documentation on the behalf of Lynn Hansen [place], MD, as directed by  Lynn Cominsky [place], MD while in the presence of Lynn Mccleod [place], MD.   I, Lynn Caliendo [place], MD, have reviewed all documentation for this visit. The documentation on 04/12/24 for the exam, diagnosis, procedures, and orders are all accurate and complete.  Lynn Chestnut G. [place], MD  [facility]. [place] office.

## 2024-04-12 NOTE — Patient Instructions (Addendum)
 A few things to remember from today's visit:  Acute gastroenteritis  Essential hypertension - Plan: Basic metabolic panel with GFR  Prediabetes - Plan: Hemoglobin A1c  Nausea and vomiting in adult - Plan: ondansetron  (ZOFRAN -ODT) 4 MG disintegrating tablet  Resume chlorthalidone . Continue monitoring blood pressures at home. Monitor for new symptoms.  If you need refills for medications you take chronically, please call your pharmacy. Do not use My Chart to request refills or for acute issues that need immediate attention. If you send a my chart message, it may take a few days to be addressed, specially if I am not in the office.  Please be sure medication list is accurate. If a new problem present, please set up appointment sooner than planned today.

## 2024-04-12 NOTE — Assessment & Plan Note (Signed)
 Last hemoglobin A1c 6.1 in 01/2024. Encouraged consistency with a healthy lifestyle. Further recommendation will be given according to lab results.

## 2024-04-12 NOTE — Assessment & Plan Note (Signed)
 She understands the benefits of wt loss as well as adverse effects of obesity. Consistency with healthy diet and physical activity encouraged. She is interested in referral to healthy weight and wellness clinic, referral placed.

## 2024-04-13 LAB — HEMOGLOBIN A1C: Hgb A1c MFr Bld: 6.8 % — ABNORMAL HIGH (ref 4.6–6.5)

## 2024-04-13 LAB — BASIC METABOLIC PANEL WITH GFR
BUN: 9 mg/dL (ref 6–23)
CO2: 28 meq/L (ref 19–32)
Calcium: 8.8 mg/dL (ref 8.4–10.5)
Chloride: 98 meq/L (ref 96–112)
Creatinine, Ser: 0.63 mg/dL (ref 0.40–1.20)
GFR: 105.59 mL/min (ref >60.00)
Glucose, Bld: 88 mg/dL (ref 70–99)
Potassium: 2.9 meq/L — ABNORMAL LOW (ref 3.5–5.1)
Sodium: 135 meq/L (ref 135–145)

## 2024-04-14 MED ORDER — SEMAGLUTIDE(0.25 OR 0.5MG/DOS) 2 MG/3ML ~~LOC~~ SOPN: PEN_INJECTOR | SUBCUTANEOUS | 1 refills | Status: DC

## 2024-04-14 MED ORDER — POTASSIUM CHLORIDE CRYS ER 20 MEQ PO TBCR: 20.0000 meq | EXTENDED_RELEASE_TABLET | Freq: Every day | ORAL | 0 refills | Status: DC

## 2024-04-18 ENCOUNTER — Encounter: Attending: Family Medicine | Admitting: Skilled Nursing Facility1

## 2024-04-18 ENCOUNTER — Encounter: Payer: Self-pay | Admitting: Skilled Nursing Facility1

## 2024-04-18 VITALS — Ht 63.0 in | Wt 258.6 lb

## 2024-04-18 DIAGNOSIS — E119 Type 2 diabetes mellitus without complications: Secondary | ICD-10-CM | POA: Insufficient documentation

## 2024-04-18 NOTE — Progress Notes (Signed)
 A1C: 6.8  DM medications: Semaglutide    Other diagnosis: Allergies Anemia Anxiety Blood transfusion Depression HTN  Pt arrives with her mother.  Pt states she likes being outdoors. Pt states she recognizes she eats too much fast food. Pt states she is living with her friend now and plans on having dinner with her mom nightly and keeping her food at her moms house. Pt states she does not have access to the kitchen like she would want at her friends house. Pt states she works 6:30-3pm. Pt states her mom will cook her meals.   Diabetes Self-Management Education  Visit Type: First/Initial  Appt. Start Time: 11:01 Appt. End Time: 12:05  04/18/2024  Lynn Hansen, identified by name and date of birth, is a 48 y.o. female with a diagnosis of Diabetes: Type 2.   ASSESSMENT  Height 5\' 3"  (1.6 m), weight 258 lb 9.6 oz (117.3 kg), last menstrual period 03/17/2024. Body mass index is 45.81 kg/m.   Diabetes Self-Management Education - 04/18/24 1106       Visit Information   Visit Type First/Initial      Initial Visit   Diabetes Type Type 2    Are you currently following a meal plan? No    Are you taking your medications as prescribed? Yes      Health Coping   How would you rate your overall health? Good      Psychosocial Assessment   Patient Belief/Attitude about Diabetes Motivated to manage diabetes    What is the hardest part about your diabetes right now, causing you the most concern, or is the most worrisome to you about your diabetes?   Making healty food and beverage choices    Self-care barriers None    Self-management support Family    Other persons present Parent    Patient Concerns Nutrition/Meal planning    Special Needs None    Preferred Learning Style Visual    Learning Readiness Contemplating    How often do you need to have someone help you when you read instructions, pamphlets, or other written materials from your doctor or pharmacy? 1 - Never       Pre-Education Assessment   Patient understands the diabetes disease and treatment process. Needs Instruction    Patient understands incorporating nutritional management into lifestyle. Needs Instruction    Patient undertands incorporating physical activity into lifestyle. Needs Instruction    Patient understands using medications safely. Needs Instruction    Patient understands monitoring blood glucose, interpreting and using results Needs Instruction    Patient understands prevention, detection, and treatment of acute complications. Needs Instruction    Patient understands prevention, detection, and treatment of chronic complications. Needs Instruction    Patient understands how to develop strategies to address psychosocial issues. Needs Instruction    Patient understands how to develop strategies to promote health/change behavior. Needs Instruction      Complications   How often do you check your blood sugar? 0 times/day (not testing)    Number of hypoglycemic episodes per month 0    Have you had a dilated eye exam in the past 12 months? Yes    Have you had a dental exam in the past 12 months? Yes    Are you checking your feet? No      Activity / Exercise   Activity / Exercise Type ADL's    How many days per week do you exercise? 0    How many minutes per day do you  exercise? 0    Total minutes per week of exercise 0      Patient Education   Previous Diabetes Education No    Disease Pathophysiology Definition of diabetes, type 1 and 2, and the diagnosis of diabetes;Factors that contribute to the development of diabetes    Healthy Eating Role of diet in the treatment of diabetes and the relationship between the three main macronutrients and blood glucose level;Food label reading, portion sizes and measuring food.;Plate Method;Reviewed blood glucose goals for pre and post meals and how to evaluate the patients' food intake on their blood glucose level.;Meal options for control of blood  glucose level and chronic complications.    Being Active Role of exercise on diabetes management, blood pressure control and cardiac health.    Medications Taught/reviewed insulin /injectables, injection, site rotation, insulin /injectables storage and needle disposal.;Reviewed patients medication for diabetes, action, purpose, timing of dose and side effects.    Monitoring Taught/evaluated SMBG meter.;Purpose and frequency of SMBG.;Daily foot exams;Identified appropriate SMBG and/or A1C goals.    Acute complications Taught prevention, symptoms, and  treatment of hypoglycemia - the 15 rule.;Discussed and identified patients' prevention, symptoms, and treatment of hyperglycemia.    Chronic complications Dental care;Retinopathy and reason for yearly dilated eye exams;Nephropathy, what it is, prevention of, the use of ACE, ARB's and early detection of through urine microalbumia.;Lipid levels, blood glucose control and heart disease;Assessed and discussed foot care and prevention of foot problems    Diabetes Stress and Support Role of stress on diabetes;Worked with patient to identify barriers to care and solutions;Identified and addressed patients feelings and concerns about diabetes      Individualized Goals (developed by patient)   Nutrition Follow meal plan discussed;General guidelines for healthy choices and portions discussed    Physical Activity Exercise 1-2 times per week;15 minutes per day    Medications take my medication as prescribed    Monitoring  Test blood glucose pre and post meals as discussed;Test my blood glucose as discussed    Problem Solving Eating Pattern    Reducing Risk do foot checks daily;treat hypoglycemia with 15 grams of carbs if blood glucose less than 70mg /dL      Post-Education Assessment   Patient understands the diabetes disease and treatment process. Demonstrates understanding / competency    Patient understands incorporating nutritional management into lifestyle.  Demonstrates understanding / competency    Patient undertands incorporating physical activity into lifestyle. Demonstrates understanding / competency    Patient understands using medications safely. Demonstrates understanding / competency    Patient understands monitoring blood glucose, interpreting and using results Demonstrates understanding / competency    Patient understands prevention, detection, and treatment of acute complications. Demonstrates understanding / competency    Patient understands prevention, detection, and treatment of chronic complications. Demonstrates understanding / competency    Patient understands how to develop strategies to address psychosocial issues. Demonstrates understanding / competency    Patient understands how to develop strategies to promote health/change behavior. Demonstrates understanding / competency      Outcomes   Expected Outcomes Demonstrated interest in learning. Expect positive outcomes    Future DMSE 4-6 wks    Program Status Completed             Individualized Plan for Diabetes Self-Management Training:   Learning Objective:  Patient will have a greater understanding of diabetes self-management. Patient education plan is to attend individual and/or group sessions per assessed needs and concerns.    Expected Outcomes:  Demonstrated interest in  learning. Expect positive outcomes  Education material provided: ADA - How to Thrive: A Guide for Your Journey with Diabetes, Food label handouts, Meal plan card, My Plate, and Snack sheet  If problems or questions, patient to contact team via:  Phone and Email  Future DSME appointment: 4-6 wks

## 2024-04-25 ENCOUNTER — Other Ambulatory Visit: Payer: Self-pay | Admitting: Family Medicine

## 2024-04-25 DIAGNOSIS — I10 Essential (primary) hypertension: Secondary | ICD-10-CM

## 2024-05-31 ENCOUNTER — Telehealth: Payer: Self-pay

## 2024-05-31 NOTE — Telephone Encounter (Signed)
 Copied from CRM [id number]. Topic: Clinical - Request for Lab/Test Order >> May 31, 2024  1:33 PM Lynn Hansen I wrote: Reason for CRM: Patient would like for a STD vaginal swab to be scheduled and ordered.

## 2024-05-31 NOTE — Telephone Encounter (Signed)
 Spoke with PCP - okay to order lab if pt having no symptoms.   I spoke with pt, she is having symptoms & would like to see PCP. Appt scheduled for Friday at 7am.

## 2024-05-31 NOTE — Telephone Encounter (Signed)
 Last seen 03/2024 - are you okay with ordering the Aptima swab and letting her do the self swab as a NV?

## 2024-06-03 ENCOUNTER — Encounter: Payer: Self-pay | Admitting: Family Medicine

## 2024-06-03 ENCOUNTER — Other Ambulatory Visit: Payer: Self-pay

## 2024-06-03 ENCOUNTER — Ambulatory Visit: Payer: Self-pay | Admitting: Family Medicine

## 2024-06-03 ENCOUNTER — Other Ambulatory Visit (HOSPITAL_COMMUNITY)
Admission: RE | Admit: 2024-06-03 | Discharge: 2024-06-03 | Disposition: A | Discharge status: Home / Self Care | Source: Ambulatory Visit | Attending: Family Medicine | Admitting: Family Medicine

## 2024-06-03 ENCOUNTER — Ambulatory Visit (INDEPENDENT_AMBULATORY_CARE_PROVIDER_SITE_OTHER): Admitting: Family Medicine

## 2024-06-03 VITALS — BP 126/80 | HR 90 | Resp 12 | Ht 63.0 in | Wt 252.4 lb

## 2024-06-03 DIAGNOSIS — Z7985 Long-term (current) use of injectable non-insulin antidiabetic drugs: Secondary | ICD-10-CM

## 2024-06-03 DIAGNOSIS — E119 Type 2 diabetes mellitus without complications: Secondary | ICD-10-CM | POA: Insufficient documentation

## 2024-06-03 DIAGNOSIS — E1169 Type 2 diabetes mellitus with other specified complication: Secondary | ICD-10-CM

## 2024-06-03 DIAGNOSIS — E785 Hyperlipidemia, unspecified: Secondary | ICD-10-CM

## 2024-06-03 DIAGNOSIS — E876 Hypokalemia: Secondary | ICD-10-CM | POA: Diagnosis not present

## 2024-06-03 DIAGNOSIS — I10 Essential (primary) hypertension: Secondary | ICD-10-CM | POA: Diagnosis not present

## 2024-06-03 DIAGNOSIS — N898 Other specified noninflammatory disorders of vagina: Secondary | ICD-10-CM | POA: Insufficient documentation

## 2024-06-03 LAB — MICROALBUMIN / CREATININE URINE RATIO
Creatinine,U: 100.1 mg/dL
Microalb Creat Ratio: 23.5 mg/g (ref 0.0–30.0)
Microalb, Ur: 2.4 mg/dL — ABNORMAL HIGH (ref 0.0–1.9)

## 2024-06-03 LAB — POTASSIUM: Potassium: 3.1 meq/L — ABNORMAL LOW (ref 3.5–5.1)

## 2024-06-03 MED ORDER — ROSUVASTATIN CALCIUM 5 MG PO TABS: 5.0000 mg | ORAL_TABLET | Freq: Every day | ORAL | 3 refills | Status: AC

## 2024-06-03 MED ORDER — POTASSIUM CHLORIDE CRYS ER 20 MEQ PO TBCR: 20.0000 meq | EXTENDED_RELEASE_TABLET | Freq: Every day | ORAL | 2 refills | Status: DC

## 2024-06-03 NOTE — Assessment & Plan Note (Signed)
 Last LDL 114 in 10/2023. She agrees with starting rosuvastatin 5 mg daily. Continue low-fat diet. We will plan on fasting lipid panel at next visit.

## 2024-06-03 NOTE — Assessment & Plan Note (Signed)
 BP.  Controlled. Continue chlorthalidone  25 mg daily. We discussed son side effects. Continue monitoring BP regularly. She is due for eye exam. Follow-up in 6 months.

## 2024-06-03 NOTE — Patient Instructions (Addendum)
 A few things to remember from today's visit:  Type 2 diabetes mellitus with other specified complication, without long-term current use of insulin  (HCC) - Plan: Microalbumin / creatinine urine ratio  Hypokalemia - Plan: Potassium  Vaginal discharge - Plan: Cervicovaginal ancillary only  Essential hypertension  Hyperlipidemia, unspecified hyperlipidemia type - Plan: rosuvastatin (CRESTOR) 5 MG tablet  Rosuvastatin 5 mg daily added today, rest no changes.  If you need refills for medications you take chronically, please call your pharmacy. Do not use My Chart to request refills or for acute issues that need immediate attention. If you send a my chart message, it may take a few days to be addressed, specially if I am not in the office.  Please be sure medication list is accurate. If a new problem present, please set up appointment sooner than planned today.

## 2024-06-03 NOTE — Assessment & Plan Note (Addendum)
 HgA1C has improved, today A1c was 6.0. Continue Ozempic  0.5 mg weekly. Regular exercise and healthy diet with avoidance of added sugar food intake is an important part of treatment and recommended. Annual eye exam, periodic dental and foot care recommended. F/U in 5 months.

## 2024-06-03 NOTE — Progress Notes (Signed)
 ACUTE VISIT Chief Complaint  Patient presents with   Vaginal Discharge   HPI: [name] is a 48 y.o. female, who is here today complaining of vaginal discharge x1 week.   Vaginal discharge: She complains of a week of vaginal discharge, stating this is new for her, She describes her vaginal discharge as a clear and transparent color.  Sexually active with one sexual partner and has concern for exposure to STDs.  Reports a hx of Trichomoniasis. Denies any pelvic pain, genital rash, dysuria, vaginal pruritus, vaginal bleeding, or urinary changes.  Hypertension:  She is on Chlorthalidone  25 mg once daily.  BP at home has been within normal range, similar to reading today..  She has not been exercising regularly.  Negative for unusual or severe headache, visual changes, exertional chest pain, dyspnea,  focal weakness, or edema. HypoK+, completed KLOR 20 meq daily. Lab Results  Component Value Date   CREATININE 0.63 04/12/2024   BUN 9 04/12/2024   NA 135 04/12/2024   K 2.9 (L) 04/12/2024   CL 98 04/12/2024   CO2 28 04/12/2024    Diabetes Mellitus II: Dx'ed 03/2024 with HgA1C 6.8. She has seen nutritionist and has made some dietary changes. Taking Ozempic  0.5 mg once weekly.  Tolerating well with no adverse side effects.  Foot exam done today.  Denies any hypoglycemic or hyperglycemic episodes of symptoms.  Lab Results  Component Value Date   HGBA1C 6.8 (H) 04/12/2024   HGBA1C 6.1 01/25/2024   HGBA1C 6.4 11/11/2023   INSULIN  4.7 05/02/2020   Hyperlipidemia: Currently on no medications.   Lab Results  Component Value Date   CHOL 193 11/11/2023   HDL 68.60 11/11/2023   LDLCALC 114 (H) 11/11/2023   TRIG 54.0 11/11/2023   CHOLHDL 3 11/11/2023   Review of Systems  Constitutional:  Negative for activity change, appetite change and fever.  HENT:  Negative for sore throat.   Respiratory:  Negative for cough and wheezing.   Gastrointestinal:  Negative for  nausea and vomiting.  Genitourinary:  Negative for decreased urine volume and hematuria.  Musculoskeletal:  Negative for joint swelling and myalgias.  Skin:  Negative for rash.  Neurological:  Negative for syncope and facial asymmetry.  See other pertinent positives and negatives in HPI.  Current Outpatient Medications on File Prior to Visit  Medication Sig Dispense Refill   albuterol  (VENTOLIN  HFA) 108 (90 Base) MCG/ACT inhaler Inhale 2 puffs into the lungs every 6 (six) hours as needed for wheezing or shortness of breath. 8 g 0   chlorthalidone  (HYGROTON ) 25 MG tablet TAKE 1 TABLET (25 MG TOTAL) BY MOUTH DAILY. 90 tablet 3   ferrous sulfate  325 (65 FE) MG tablet Take 1 tablet (325 mg total) by mouth daily. 90 tablet 2   fluticasone  (FLONASE ) 50 MCG/ACT nasal spray Place 1 spray into both nostrils 2 (two) times daily. 16 g 3   fluticasone -salmeterol (ADVAIR) 250-50 MCG/ACT AEPB Inhale 1 puff into the lungs in the morning and at bedtime. 60 each 2   nystatin -triamcinolone  (MYCOLOG II) cream Apply 1 Application topically 2 (two) times daily as needed. 45 g 0   omeprazole  (PRILOSEC) 20 MG capsule Take 20 mg by mouth daily. Unsure of strength     potassium chloride  SA (KLOR-CON  M) 20 MEQ tablet Take 1 tablet (20 mEq total) by mouth daily for 7 days. 7 tablet 0   Semaglutide ,0.25 or 0.5MG /DOS, 2 MG/3ML SOPN 0.25 mg weekly for 4 wks then 0.5 mg weekly  x 4 wks then 1 mg weekly. 3 mL 1   No current facility-administered medications on file prior to visit.    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   HSV  (herpes simplex virus) infection    Hypertension    Lactose intolerance    Migraines    Pre-diabetes    Sebaceous cyst of ear    right ear   No Known Allergies  Social History   Socioeconomic History   Marital status: Single    Spouse name: Not on file   Number of children: 1   Years of education: Not on file   Highest education level: Not on file  Occupational History   Occupation: Engineer, site  Tobacco Use   Smoking status: Never   Smokeless tobacco: Never  Vaping Use   Vaping status: Never Used  Substance and Sexual Activity   Alcohol use: Not Currently   Drug use: No   Sexual activity: Yes    Partners: Male  Other Topics Concern   Not on file  Social History Narrative   Not on file   Social Drivers of Health   Financial Resource Strain: Not on file  Food Insecurity: Not on file  Transportation Needs: Not on file  Physical Activity: Not on file  Stress: Not on file  Social Connections: Not on file   Vitals:   06/03/24 0711  BP: 126/80  Pulse: 90  Resp: 12  SpO2: 98%   Wt Readings from Last 3 Encounters:  06/03/24 252 lb 6 oz (114.5 kg)  04/18/24 258 lb 9.6 oz (117.3 kg)  04/12/24 256 lb 6.4 oz (116.3 kg)   Body mass index is 44.71 kg/m.  Physical Exam Vitals and nursing note reviewed.  Constitutional:      General: She is not in acute distress.    Appearance: She is well-developed.  HENT:     Head: Normocephalic and atraumatic.   Eyes:     Conjunctiva/sclera: Conjunctivae normal.    Cardiovascular:     Rate and Rhythm: Normal rate and regular rhythm.     Pulses:          Dorsalis pedis pulses are 2+ on the right side and 2+ on the left side.     Heart sounds: No murmur heard. Pulmonary:     Effort: Pulmonary effort is normal. No respiratory distress.     Breath sounds: Normal breath sounds.  Abdominal:     Palpations: Abdomen is soft. There is no hepatomegaly or mass.     Tenderness: There is no abdominal tenderness.   Genitourinary:    Comments: She prefers to hold on pelvic exam.  Skin:    General: Skin is warm.     Findings: No erythema or rash.   Neurological:     General: No focal deficit present.     Mental Status: She is alert and oriented to person, place, and time.     Cranial Nerves: No cranial nerve deficit.  Gait: Gait normal.   Psychiatric:        Mood and Affect: Mood and affect normal.    ASSESSMENT AND PLAN: [name] was seen today for vaginal discharge.  Type 2 diabetes mellitus with other specified complication, without long-term current use of insulin  [facility]) Assessment & Plan: HgA1C has improved, today A1c was 6.0. Continue Ozempic  0.5 mg weekly. Regular exercise and healthy diet with avoidance of added sugar food intake is an important part of treatment and recommended. Annual eye exam, periodic dental and foot care recommended. F/U in 5 months.  Orders: -     Microalbumin / creatinine urine ratio; Future  Vaginal discharge We discussed possible etiologies, history suggest physiologic discharge. Today she is not interested in HIV or syphilis screening. Further recommendation will be given according to lab results.  -     Cervicovaginal ancillary only  Hypokalemia She is no longer on potassium supplementation. Currently on chlorthalidone  for BP management. Further recommendation will be given according to lab result.  -     Potassium; Future  Essential hypertension Assessment & Plan: BP.  Controlled. Continue chlorthalidone  25 mg daily. We discussed son side effects. Continue monitoring BP regularly. She is due for eye exam. Follow-up in 6 months.  Hyperlipidemia, unspecified hyperlipidemia type Assessment & Plan: Last LDL 114 in 10/2023. She agrees with starting rosuvastatin 5 mg daily. Continue low-fat diet. We will plan on fasting lipid panel at next visit.  Orders: -     Rosuvastatin Calcium; Take 1 tablet (5 mg total) by mouth  daily.  Dispense: 90 tablet; Refill: 3  Return in about 5 months (around 11/03/2024) for chronic problems.  I, Bernita Bristle, acting as a scribe for Ayshia Gramlich [place], MD., have documented all relevant documentation on the behalf of Lynn Hansen [place], MD, as directed by   while in the presence of Jemari Hallum [place], MD.  I, Pilar Corrales [place], MD, have reviewed all documentation for this visit. The documentation on 06/03/24 for the exam, diagnosis, procedures, and orders are all accurate and complete.  Lynn Hansen G. [place], MD  [facility]. [place] office.

## 2024-06-06 LAB — CERVICOVAGINAL ANCILLARY ONLY
Chlamydia: NEGATIVE
Comment: NEGATIVE
Comment: NEGATIVE
Comment: NORMAL
Neisseria Gonorrhea: NEGATIVE
Trichomonas: NEGATIVE

## 2024-06-07 ENCOUNTER — Ambulatory Visit: Admitting: Skilled Nursing Facility1

## 2024-07-06 ENCOUNTER — Other Ambulatory Visit: Payer: Self-pay | Admitting: Family Medicine

## 2024-07-06 DIAGNOSIS — E1169 Type 2 diabetes mellitus with other specified complication: Secondary | ICD-10-CM

## 2024-07-11 ENCOUNTER — Encounter: Payer: Self-pay | Attending: Family Medicine | Admitting: Skilled Nursing Facility1

## 2024-07-11 ENCOUNTER — Encounter: Payer: Self-pay | Admitting: Skilled Nursing Facility1

## 2024-07-11 VITALS — Ht 63.0 in | Wt 249.0 lb

## 2024-07-11 DIAGNOSIS — E1169 Type 2 diabetes mellitus with other specified complication: Secondary | ICD-10-CM | POA: Insufficient documentation

## 2024-07-11 NOTE — Progress Notes (Unsigned)
 A1C: 6.8  DM medications: Semaglutide    Other diagnosis: Allergies Anemia Anxiety Blood transfusion Depression HTN  Pt states she works 6:30-3pm. Pt states her mom will cook her meals.   Pt states she does read the label looking for added sugars to avoid them. Pt states she would like to make a meal plan to eat that instead of fast food. Pt states she us  taking Ozempic  now stating she does not have an appetite.  Pt states she has been walking 30 minutes stating she went back to [employer].  Pt states she has been drinking more water and avoiding soda. Pt states she has cut back on fats food as well.   Created a specific food plan with pt that included 4 breakfast options and 4 lunch options and 4 snack options.  Diabetes Self-Management Education  Visit Type: Follow-up  Appt. Start Time: 3:19 Appt. End Time: 4:00  07/13/2024  Lynn Hansen, identified by name and date of birth, is a 48 y.o. female with a diagnosis of Diabetes:  .   ASSESSMENT  Height 5' 3 (1.6 m), weight 249 lb (112.9 kg). Body mass index is 44.11 kg/m.   Diabetes Self-Management Education - 07/13/24 1712       Visit Information   Visit Type Follow-up      Health Coping   How would you rate your overall health? Good      Psychosocial Assessment   Patient Belief/Attitude about Diabetes Motivated to manage diabetes    What is the hardest part about your diabetes right now, causing you the most concern, or is the most worrisome to you about your diabetes?   Making healty food and beverage choices    Self-care barriers Unable to determine    Patient Concerns Nutrition/Meal planning;Weight Control    Special Needs None    Learning Readiness Contemplating    How often do you need to have someone help you when you read instructions, pamphlets, or other written materials from your doctor or pharmacy? 1 - Never      Pre-Education Assessment   Patient understands the diabetes disease and treatment  process. Demonstrates understanding / competency    Patient understands incorporating nutritional management into lifestyle. Demonstrates understanding / competency    Patient undertands incorporating physical activity into lifestyle. Demonstrates understanding / competency    Patient understands using medications safely. Demonstrates understanding / competency    Patient understands monitoring blood glucose, interpreting and using results Demonstrates understanding / competency    Patient understands prevention, detection, and treatment of acute complications. Demonstrates understanding / competency    Patient understands prevention, detection, and treatment of chronic complications. Demonstrates understanding / competency    Patient understands how to develop strategies to address psychosocial issues. Demonstrates understanding / competency    Patient understands how to develop strategies to promote health/change behavior. Demonstrates understanding / competency      Complications   Last HgB A1C per patient/outside source 6.8 %    How often do you check your blood sugar? 0 times/day (not testing)      Activity / Exercise   Activity / Exercise Type ADL's    How many days per week do you exercise? 0    How many minutes per day do you exercise? 0    Total minutes per week of exercise 0      Patient Education   Healthy Eating Role of diet in the treatment of diabetes and the relationship between the three main  macronutrients and blood glucose level;Food label reading, portion sizes and measuring food.;Plate Method;Carbohydrate counting;Reviewed blood glucose goals for pre and post meals and how to evaluate the patients' food intake on their blood glucose level.;Effects of alcohol on blood glucose and safety factors with consumption of alcohol.;Information on hints to eating out and maintain blood glucose control.;Meal options for control of blood glucose level and chronic complications.       Individualized Goals (developed by patient)   Nutrition Follow meal plan discussed;General guidelines for healthy choices and portions discussed    Problem Solving Eating Pattern      Patient Self-Evaluation of Goals - Patient rates self as meeting previously set goals (% of time)   Nutrition 25 - 50% (sometimes)    Physical Activity < 25% (hardly ever/never)    Medications >75% (most of the time)    Monitoring < 25% (hardly ever/never)    Problem Solving and behavior change strategies  >75% (most of the time)    Reducing Risk (treating acute and chronic complications) >75% (most of the time)    Health Coping >75% (most of the time)      Post-Education Assessment   Patient understands the diabetes disease and treatment process. Demonstrates understanding / competency    Patient understands incorporating nutritional management into lifestyle. Demonstrates understanding / competency    Patient undertands incorporating physical activity into lifestyle. Demonstrates understanding / competency    Patient understands using medications safely. Demonstrates understanding / competency    Patient understands monitoring blood glucose, interpreting and using results Demonstrates understanding / competency    Patient understands prevention, detection, and treatment of acute complications. Demonstrates understanding / competency    Patient understands prevention, detection, and treatment of chronic complications. Demonstrates understanding / competency    Patient understands how to develop strategies to address psychosocial issues. Demonstrates understanding / competency    Patient understands how to develop strategies to promote health/change behavior. Demonstrates understanding / competency      Outcomes   Expected Outcomes Demonstrated interest in learning but significant barriers to change    Future DMSE 4-6 wks    Program Status Completed      Subsequent Visit   Since your last visit have you  continued or begun to take your medications as prescribed? Yes    Since your last visit have you experienced any weight changes? Loss          Individualized Plan for Diabetes Self-Management Training:   Learning Objective:  Patient will have a greater understanding of diabetes self-management. Patient education plan is to attend individual and/or group sessions per assessed needs and concerns.   Expected Outcomes:  Demonstrated interest in learning but significant barriers to change  Education material provided: ADA - How to Thrive: A Guide for Your Journey with Diabetes, Carbohydrate counting sheet, and No sodium seasonings  If problems or questions, patient to contact team via:  Phone and Email  Future DSME appointment: 4-6 wks

## 2024-09-03 ENCOUNTER — Other Ambulatory Visit: Payer: Self-pay | Admitting: Family Medicine

## 2024-09-03 DIAGNOSIS — E559 Vitamin D deficiency, unspecified: Secondary | ICD-10-CM

## 2024-09-23 ENCOUNTER — Other Ambulatory Visit: Payer: Self-pay | Admitting: Obstetrics and Gynecology

## 2024-09-23 DIAGNOSIS — Z1231 Encounter for screening mammogram for malignant neoplasm of breast: Secondary | ICD-10-CM

## 2024-10-24 ENCOUNTER — Ambulatory Visit: Admitting: Skilled Nursing Facility1

## 2024-10-28 ENCOUNTER — Ambulatory Visit

## 2024-11-07 ENCOUNTER — Ambulatory Visit: Payer: Self-pay

## 2024-11-07 ENCOUNTER — Encounter: Payer: Self-pay | Admitting: Family Medicine

## 2024-11-07 ENCOUNTER — Telehealth: Admitting: Family Medicine

## 2024-11-07 VITALS — BP 123/80 | Ht 63.0 in | Wt 246.0 lb

## 2024-11-07 DIAGNOSIS — J4520 Mild intermittent asthma, uncomplicated: Secondary | ICD-10-CM | POA: Diagnosis not present

## 2024-11-07 DIAGNOSIS — J069 Acute upper respiratory infection, unspecified: Secondary | ICD-10-CM

## 2024-11-07 DIAGNOSIS — R051 Acute cough: Secondary | ICD-10-CM | POA: Diagnosis not present

## 2024-11-07 MED ORDER — PREDNISONE 20 MG PO TABS: 40.0000 mg | ORAL_TABLET | Freq: Every day | ORAL | 0 refills | Status: AC

## 2024-11-07 MED ORDER — ALBUTEROL SULFATE HFA 108 (90 BASE) MCG/ACT IN AERS: 2.0000 | INHALATION_SPRAY | Freq: Four times a day (QID) | RESPIRATORY_TRACT | 1 refills | Status: AC | PRN

## 2024-11-07 MED ORDER — BENZONATATE 100 MG PO CAPS: 100.0000 mg | ORAL_CAPSULE | Freq: Two times a day (BID) | ORAL | 0 refills | Status: AC | PRN

## 2024-11-07 MED ORDER — HYDROCODONE BIT-HOMATROP MBR 5-1.5 MG/5ML PO SOLN: 5.0000 mL | Freq: Every evening | ORAL | 0 refills | Status: DC | PRN

## 2024-11-07 NOTE — Telephone Encounter (Signed)
 FYI Only or Action Required?: FYI only for provider: appointment scheduled on Virtual 11/07/24.  Patient was last seen in primary care on 06/03/2024 by Jordan, Betty G, MD.  Called Nurse Triage reporting Cough.  Symptoms began several days ago.  Interventions attempted: OTC medications: Dayquil, Nyquil.  Symptoms are: stable.  Triage Disposition: See Physician Within 24 Hours  Patient/caregiver understands and will follow disposition?: Yes Reason for Disposition  Earache  Answer Assessment - Initial Assessment Questions Patient has tried Nyquil and Dayquil, little to no relief, mostly suppresses cough. Advil. Chest pain from coughing. Patient requested virtual appointment today.  1. ONSET: When did the cough begin?      Friday  2. SEVERITY: How bad is the cough today?      Moderate  3. SPUTUM: Describe the color of your sputum (e.g., none, dry cough; clear, white, yellow, green)     Yellow-ish  4. HEMOPTYSIS: Are you coughing up any blood? If Yes, ask: How much? (e.g., flecks, streaks, tablespoons, etc.)     Denies  5. DIFFICULTY BREATHING: Are you having difficulty breathing? If Yes, ask: How bad is it? (e.g., mild, moderate, severe)      Denies  6. FEVER: Do you have a fever? If Yes, ask: What is your temperature, how was it measured, and when did it start?     Denies  7. CARDIAC HISTORY: Do you have any history of heart disease? (e.g., heart attack, congestive heart failure)      Denies  8. LUNG HISTORY: Do you have any history of lung disease?  (e.g., pulmonary embolus, asthma, emphysema)     Denies  9. OTHER SYMPTOMS: Do you have any other symptoms? (e.g., runny nose, wheezing, chest pain)       Wheezing, body aches, headaches, ears are sensitive. Coughing so much throat felt like knives sticking in it. Some nasal congestion.  Protocols used: Cough - Acute Productive-A-AH  Copied from CRM #[id number]. Topic: Clinical - Red Word Triage >>  Nov 07, 2024  9:18 AM Joesph NOVAK wrote: Red Word that prompted transfer to Nurse Triage: Flu symptoms, coughing up phlegm , body ache, headache, ears are sensitive. Wheezing.
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
## 2024-11-14 ENCOUNTER — Ambulatory Visit: Payer: Self-pay | Admitting: Family Medicine

## 2024-11-14 ENCOUNTER — Ambulatory Visit: Admitting: Family Medicine

## 2024-11-14 ENCOUNTER — Encounter: Payer: Self-pay | Admitting: Family Medicine

## 2024-11-14 VITALS — BP 136/80 | HR 81 | Temp 97.9°F | Resp 16 | Ht 63.0 in | Wt 246.0 lb

## 2024-11-14 DIAGNOSIS — E876 Hypokalemia: Secondary | ICD-10-CM | POA: Insufficient documentation

## 2024-11-14 DIAGNOSIS — E785 Hyperlipidemia, unspecified: Secondary | ICD-10-CM | POA: Diagnosis not present

## 2024-11-14 DIAGNOSIS — E559 Vitamin D deficiency, unspecified: Secondary | ICD-10-CM

## 2024-11-14 DIAGNOSIS — Z0000 Encounter for general adult medical examination without abnormal findings: Secondary | ICD-10-CM

## 2024-11-14 DIAGNOSIS — E1169 Type 2 diabetes mellitus with other specified complication: Secondary | ICD-10-CM | POA: Diagnosis not present

## 2024-11-14 DIAGNOSIS — I10 Essential (primary) hypertension: Secondary | ICD-10-CM | POA: Diagnosis not present

## 2024-11-14 DIAGNOSIS — Z7985 Long-term (current) use of injectable non-insulin antidiabetic drugs: Secondary | ICD-10-CM

## 2024-11-14 DIAGNOSIS — D509 Iron deficiency anemia, unspecified: Secondary | ICD-10-CM

## 2024-11-14 DIAGNOSIS — D500 Iron deficiency anemia secondary to blood loss (chronic): Secondary | ICD-10-CM | POA: Diagnosis not present

## 2024-11-14 LAB — IRON: Iron: 27 ug/dL — ABNORMAL LOW (ref 42–145)

## 2024-11-14 LAB — LIPID PANEL
Cholesterol: 134 mg/dL (ref 0–200)
HDL: 60.6 mg/dL (ref >39.00)
LDL Cholesterol: 60 mg/dL (ref 0–99)
NonHDL: 73.68
Total CHOL/HDL Ratio: 2
Triglycerides: 67 mg/dL (ref 0.0–149.0)
VLDL: 13.4 mg/dL (ref 0.0–40.0)

## 2024-11-14 LAB — CBC
HCT: 34.8 % — ABNORMAL LOW (ref 36.0–46.0)
Hemoglobin: 10.9 g/dL — ABNORMAL LOW (ref 12.0–15.0)
MCHC: 31.4 g/dL (ref 30.0–36.0)
MCV: 71.9 fl — ABNORMAL LOW (ref 78.0–100.0)
Platelets: 470 K/uL — ABNORMAL HIGH (ref 150.0–400.0)
RBC: 4.84 Mil/uL (ref 3.87–5.11)
RDW: 17.3 % — ABNORMAL HIGH (ref 11.5–15.5)
WBC: 8.2 K/uL (ref 4.0–10.5)

## 2024-11-14 LAB — VITAMIN D 25 HYDROXY (VIT D DEFICIENCY, FRACTURES): VITD: 13.86 ng/mL — ABNORMAL LOW (ref 30.00–100.00)

## 2024-11-14 LAB — COMPREHENSIVE METABOLIC PANEL WITH GFR
ALT: 21 U/L (ref 0–35)
AST: 17 U/L (ref 0–37)
Albumin: 4.2 g/dL (ref 3.5–5.2)
Alkaline Phosphatase: 59 U/L (ref 39–117)
BUN: 11 mg/dL (ref 6–23)
CO2: 33 meq/L — ABNORMAL HIGH (ref 19–32)
Calcium: 9.2 mg/dL (ref 8.4–10.5)
Chloride: 100 meq/L (ref 96–112)
Creatinine, Ser: 0.66 mg/dL (ref 0.40–1.20)
GFR: 103.98 mL/min (ref >60.00)
Glucose, Bld: 100 mg/dL — ABNORMAL HIGH (ref 70–99)
Potassium: 3 meq/L — ABNORMAL LOW (ref 3.5–5.1)
Sodium: 141 meq/L (ref 135–145)
Total Bilirubin: 0.6 mg/dL (ref 0.2–1.2)
Total Protein: 7.5 g/dL (ref 6.0–8.3)

## 2024-11-14 LAB — HEMOGLOBIN A1C: Hgb A1c MFr Bld: 6.1 % (ref 4.6–6.5)

## 2024-11-14 LAB — FERRITIN: Ferritin: 5.5 ng/mL — ABNORMAL LOW (ref 10.0–291.0)

## 2024-11-14 MED ORDER — FERROUS SULFATE 325 (65 FE) MG PO TABS: 325.0000 mg | ORAL_TABLET | ORAL | 2 refills | Status: AC

## 2024-11-14 MED ORDER — POTASSIUM CHLORIDE CRYS ER 20 MEQ PO TBCR: 20.0000 meq | EXTENDED_RELEASE_TABLET | Freq: Every day | ORAL | 2 refills | Status: AC

## 2024-11-14 MED ORDER — VITAMIN D (ERGOCALCIFEROL) 1.25 MG (50000 UNIT) PO CAPS: 50000.0000 [IU] | ORAL_CAPSULE | ORAL | 0 refills | Status: AC
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
## 2024-11-14 NOTE — Assessment & Plan Note (Signed)
 Reporting lower BP's at work, 120's/80. Continue chlorthalidone  25 mg daily. Continue monitoring BP regularly. Follow-up in 6 months.

## 2024-11-14 NOTE — Assessment & Plan Note (Signed)
She is not on vit D supplementation. Further recommendations according to 25 OH vit D result. 

## 2024-11-14 NOTE — Assessment & Plan Note (Signed)
 Currently she is not on K-Lor. We discussed son side effects of chlorthalidone . Further recommendation will be given according to lab results.

## 2024-11-14 NOTE — Assessment & Plan Note (Signed)
 Hx of DUB, heavy menses. she is not on iron supplementation. Colonoscopy in 09/2022. Further recommendation will be given according to CBC and iron studies results.

## 2024-11-14 NOTE — Assessment & Plan Note (Signed)
We discussed the importance of regular physical activity and healthy diet for prevention of chronic illness and/or complications. Preventive guidelines reviewed. Vaccination up-to-date. Continue her female preventive care with her gynecologist. Next CPE in a year.

## 2024-11-14 NOTE — Patient Instructions (Addendum)
 A few things to remember from today's visit:  Routine general medical examination at a health care facility  Type 2 diabetes mellitus with other specified complication, without long-term current use of insulin  (HCC) - Plan: Hemoglobin A1c  Hyperlipidemia, unspecified hyperlipidemia type - Plan: Comprehensive metabolic panel with GFR, Lipid panel  Essential hypertension - Plan: Comprehensive metabolic panel with GFR, Aldosterone + renin activity w/ ratio  Iron deficiency anemia due to chronic blood loss - Plan: CBC, Iron, Ferritin  Hypokalemia - Plan: Comprehensive metabolic panel with GFR, Aldosterone + renin activity w/ ratio  Vitamin D  deficiency, unspecified - Plan: VITAMIN D  25 Hydroxy (Vit-D Deficiency, Fractures) No changes today. If you need refills for medications you take chronically, please call your pharmacy. Do not use My Chart to request refills or for acute issues that need immediate attention. If you send a my chart message, it may take a few days to be addressed, specially if I am not in the office.  Please be sure medication list is accurate. If a new problem present, please set up appointment sooner than planned today.  Health Maintenance, Female Adopting a healthy lifestyle and getting preventive care are important in promoting health and wellness. Ask your health care provider about: The right schedule for you to have regular tests and exams. Things you can do on your own to prevent diseases and keep yourself healthy. What should I know about diet, weight, and exercise? Eat a healthy diet  Eat a diet that includes plenty of vegetables, fruits, low-fat dairy products, and lean protein. Do not eat a lot of foods that are high in solid fats, added sugars, or sodium. Maintain a healthy weight Body mass index (BMI) is used to identify weight problems. It estimates body fat based on height and weight. Your health care provider can help determine your BMI and help you  achieve or maintain a healthy weight. Get regular exercise Get regular exercise. This is one of the most important things you can do for your health. Most adults should: Exercise for at least 150 minutes each week. The exercise should increase your heart rate and make you sweat (moderate-intensity exercise). Do strengthening exercises at least twice a week. This is in addition to the moderate-intensity exercise. Spend less time sitting. Even light physical activity can be beneficial. Watch cholesterol and blood lipids Have your blood tested for lipids and cholesterol at 48 years of age, then have this test every 5 years. Have your cholesterol levels checked more often if: Your lipid or cholesterol levels are high. You are older than 48 years of age. You are at high risk for heart disease. What should I know about cancer screening? Depending on your health history and family history, you may need to have cancer screening at various ages. This may include screening for: Breast cancer. Cervical cancer. Colorectal cancer. Skin cancer. Lung cancer. What should I know about heart disease, diabetes, and high blood pressure? Blood pressure and heart disease High blood pressure causes heart disease and increases the risk of stroke. This is more likely to develop in people who have high blood pressure readings or are overweight. Have your blood pressure checked: Every 3-5 years if you are 45-74 years of age. Every year if you are 63 years old or older. Diabetes Have regular diabetes screenings. This checks your fasting blood sugar level. Have the screening done: Once every three years after age 68 if you are at a normal weight and have a low risk for  diabetes. More often and at a younger age if you are overweight or have a high risk for diabetes. What should I know about preventing infection? Hepatitis B If you have a higher risk for hepatitis B, you should be screened for this virus. Talk with  your health care provider to find out if you are at risk for hepatitis B infection. Hepatitis C Testing is recommended for: Everyone born from [birth year] through 1965. Anyone with known risk factors for hepatitis C. Sexually transmitted infections (STIs) Get screened for STIs, including gonorrhea and chlamydia, if: You are sexually active and are younger than 48 years of age. You are older than 48 years of age and your health care provider tells you that you are at risk for this type of infection. Your sexual activity has changed since you were last screened, and you are at increased risk for chlamydia or gonorrhea. Ask your health care provider if you are at risk. Ask your health care provider about whether you are at high risk for HIV. Your health care provider may recommend a prescription medicine to help prevent HIV infection. If you choose to take medicine to prevent HIV, you should first get tested for HIV. You should then be tested every 3 months for as long as you are taking the medicine. Pregnancy If you are about to stop having your period (premenopausal) and you may become pregnant, seek counseling before you get pregnant. Take 400 to 800 micrograms (mcg) of folic acid every day if you become pregnant. Ask for birth control (contraception) if you want to prevent pregnancy. Osteoporosis and menopause Osteoporosis is a disease in which the bones lose minerals and strength with aging. This can result in bone fractures. If you are 34 years old or older, or if you are at risk for osteoporosis and fractures, ask your health care provider if you should: Be screened for bone loss. Take a calcium  or vitamin D  supplement to lower your risk of fractures. Be given hormone replacement therapy (HRT) to treat symptoms of menopause. Follow these instructions at home: Alcohol use Do not drink alcohol if: Your health care provider tells you not to drink. You are pregnant, may be pregnant, or are planning  to become pregnant. If you drink alcohol: Limit how much you have to: 0-1 drink a day. Know how much alcohol is in your drink. In the [place] one drink equals one 12 oz bottle of beer (355 mL), one 5 oz glass of wine (148 mL), or one 1 oz glass of hard liquor (44 mL). Lifestyle Do not use any products that contain nicotine or tobacco. These products include cigarettes, chewing tobacco, and vaping devices, such as e-cigarettes. If you need help quitting, ask your health care provider. Do not use street drugs. Do not share needles. Ask your health care provider for help if you need support or information about quitting drugs. General instructions Schedule regular health, dental, and eye exams. Stay current with your vaccines. Tell your health care provider if: You often feel depressed. You have ever been abused or do not feel safe at home. Summary Adopting a healthy lifestyle and getting preventive care are important in promoting health and wellness. Follow your health care provider's instructions about healthy diet, exercising, and getting tested or screened for diseases. Follow your health care provider's instructions on monitoring your cholesterol and blood pressure. This information is not intended to replace advice given to you by your health care provider. Make sure you discuss any questions  you have with your health care provider. Document Revised: 04/22/2021 Document Reviewed: 04/22/2021 Elsevier Patient Education  2024 [employer].

## 2024-11-14 NOTE — Assessment & Plan Note (Signed)
 HgA1C has been at goal, last one 6.8 in 03/2024. Continue Ozempic  0.5 mg weekly. Annual eye exam, periodic dental and foot care recommended. F/U in 6 months.

## 2024-11-14 NOTE — Assessment & Plan Note (Signed)
 Last LDL 114 in 10/2023. Continue rosuvastatin  5 mg daily and low-fat diet. Further recommendations according to lipid panel result.

## 2024-11-18 LAB — ALDOSTERONE + RENIN ACTIVITY W/ RATIO
ALDO / PRA Ratio: 8.6 ratio (ref 0.9–28.9)
Aldosterone: 5 ng/dL
Renin Activity: 0.58 ng/mL/h (ref 0.25–5.82)

## 2025-05-15 ENCOUNTER — Ambulatory Visit: Admitting: Family Medicine
